# Patient Record
Sex: Female | Born: 1985 | Race: Black or African American | Hispanic: No | Marital: Married | Smoking: Never smoker
Health system: Southern US, Community
[De-identification: ages and names within clinical notes are randomized; demographics above are authoritative.]

## PROBLEM LIST (undated history)

## (undated) DIAGNOSIS — Z3490 Encounter for supervision of normal pregnancy, unspecified, unspecified trimester: Secondary | ICD-10-CM

## (undated) DIAGNOSIS — D649 Anemia, unspecified: Secondary | ICD-10-CM

## (undated) DIAGNOSIS — R110 Nausea: Secondary | ICD-10-CM

## (undated) HISTORY — DX: Nausea: R11.0

## (undated) HISTORY — DX: Encounter for supervision of normal pregnancy, unspecified, unspecified trimester: Z34.90

---
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
## 2006-10-29 ENCOUNTER — Emergency Department (HOSPITAL_COMMUNITY): Admission: EM | Admit: 2006-10-29 | Discharge: 2006-10-29 | Payer: Self-pay | Admitting: Emergency Medicine

## 2007-04-18 ENCOUNTER — Ambulatory Visit: Payer: Self-pay | Admitting: Oncology

## 2007-05-04 ENCOUNTER — Ambulatory Visit (HOSPITAL_COMMUNITY): Admission: RE | Admit: 2007-05-04 | Discharge: 2007-05-04 | Payer: Self-pay | Admitting: Obstetrics and Gynecology

## 2007-06-26 ENCOUNTER — Inpatient Hospital Stay (HOSPITAL_COMMUNITY): Admission: AD | Admit: 2007-06-26 | Discharge: 2007-06-26 | Payer: Self-pay | Admitting: Obstetrics and Gynecology

## 2007-06-30 ENCOUNTER — Inpatient Hospital Stay (HOSPITAL_COMMUNITY): Admission: AD | Admit: 2007-06-30 | Discharge: 2007-07-02 | Payer: Self-pay | Admitting: Obstetrics and Gynecology

## 2008-04-26 ENCOUNTER — Emergency Department (HOSPITAL_COMMUNITY): Admission: EM | Admit: 2008-04-26 | Discharge: 2008-04-26 | Payer: Self-pay | Admitting: Emergency Medicine

## 2008-09-02 ENCOUNTER — Emergency Department (HOSPITAL_COMMUNITY): Admission: EM | Admit: 2008-09-02 | Discharge: 2008-09-02 | Payer: Self-pay | Admitting: Emergency Medicine

## 2009-09-08 ENCOUNTER — Emergency Department (HOSPITAL_COMMUNITY): Admission: EM | Admit: 2009-09-08 | Discharge: 2009-09-08 | Payer: Self-pay | Admitting: Emergency Medicine

## 2010-03-18 ENCOUNTER — Emergency Department (HOSPITAL_COMMUNITY)
Admission: EM | Admit: 2010-03-18 | Discharge: 2010-03-18 | Payer: Self-pay | Source: Home / Self Care | Admitting: Emergency Medicine

## 2010-06-14 ENCOUNTER — Encounter: Payer: Self-pay | Admitting: Obstetrics and Gynecology

## 2010-08-11 LAB — POCT PREGNANCY, URINE: Preg Test, Ur: NEGATIVE

## 2010-10-06 NOTE — H&P (Signed)
Sharon, Lutz            ACCOUNT NO.:  [id number]   MEDICAL RECORD NO.:  [id number]          PATIENT TYPE:  INP   LOCATION:  9165                          FACILITY:  [facility]   PHYSICIAN:  Hal Morales, M.D.DATE OF BIRTH:  [birth year]   DATE OF ADMISSION:  06/30/2007  DATE OF DISCHARGE:                              HISTORY & PHYSICAL   The patient is a 25 year old, single black female, gravida 2, para 0-0-1-  0 at 39-4/7 weeks for an [facility] of July 04, 2007 which was set by a 12-  week ultrasound who presents a chief complaint of spontaneous rupture of  membranes at 2330 on June 29, 2007.  The patient denies any vaginal  bleeding, dysuria, nausea, vomiting, diarrhea, PIH signs or symptoms.  Reports positive fetal movement.  She denies any pain. She reports some  cramping. She also reports that she was 3 cm in the office yesterday.  Her pregnancy has been followed by the MD service at [facility].  Her history is remarkable:  1. Group beta strep positive.  2. Thrombocytopenia.  3. Conception just after discontinuation of oral contraceptive pills.   PRENATAL LABORATORIES:  The patient's blood type is O positive, RH  antibody screen negative.  RPR nonreactive.  Rubella titer immune.  Hepatitis surface antigen negative.  HIV nonreactive.  She had an  hemoglobin electrophoresis which was within normal limits.  She declined  cystic fibrosis testing. Gonorrhea and Chlamydia negative.  Group Beta  strep positive.  Platelets at her new OB visit July 8 were 149.  Hemoglobin at that time was 11.7.  She had a normal one-hour GTT.  Her  platelets have been checked numerous times during the pregnancy.  She  did fail to follow up with a hem/onc consult as recommended per MDs at  [facility].  Platelets on January 5 were equal to 117.  That is  the last platelet count on her prenatal record which only goes to 36-3/7  weeks.   OB HISTORY:  Gravida 1, with an induced  abortion at approximately 13  weeks in April 2004.  She denied any complications.  G2 with current  pregnancy.   PAST MEDICAL HISTORY:  1. The patient reports previous use of Seasonale and condoms for      contraception.  2. She reports chicken pox as a child and rubella vaccine.  3. She reports menarche at 25 years of age with 28-day cycles, 5 days      of flow.   GENETIC HISTORY:  Unremarkable.   PAST SURGICAL HISTORY:  Denies any surgical history with the exception  of a root canal.   FAMILY HISTORY:  Maternal grandmother with chronic hypertension.  Paternal grandmother, maternal aunt and maternal uncle with diabetes.  Paternal grandmother TIAs.  Mom with nicotine addiction.   SOCIAL HISTORY:  Single black female.  Did not report a religious  affiliation.  Full time student at [employer].  She reports 13 years of  education.  Father of baby self-employed, 12 years of education.  She  denied any alcohol use or illicit drug use.  Did  report second-hand  smoke exposure.   HISTORY OF PRESENT PREGNANCY:  The patient began care at approximately 7-  6/7 weeks for a new OB interview.  She did have a new OB work-up July 8  at approximately 10-5/7 weeks.  The patient did have a first trimester  screen which was within normal limits at approximately 12 weeks.  Ultrasound that day showed SIUP 12 weeks and 6 days, giving EDC of  July 03, 2006, giving our best [facility].  Placenta was anterior. First  trimester screen was within normal limits, nasal bone present.  The  patient had lost 13 pounds initially at the beginning of pregnancy and  did have good weight gain overall. The patient had an anatomy scan at 19-  5/7 weeks. All anatomy was not seen at that day and plan was made to  repeat.  The patient had a followup AFP which was normal.  Anatomy scan  was repeated at 23-6/7 weeks.  All anatomy not previously seen was  visualized and normal growth and development were noted.  Cervical  length was  3.22.  The patient had one-hour GTT at 28-2/7 weeks.  Platelets were redrawn equal to 119.  Plans were made for the patient to  have hem/onc consult regarding thrombocytopenia in which the patient was  unable to make an appointment and did not have followup.  The patient  had a maternal fetal medicine consult on  December 10 regarding her  thrombocytopenia.  Platelets on December 9 were equal to 129.  Per MFM,  the patient was to have weekly platelet count drawn.  The patient did  have complications with reflux for almost 35 weeks.  The patient did  have an episode of vaginal bleeding at approximately 36 weeks.  Ultrasounds were suggestive of a female.  The patient plans to name the  baby Rodman Pickle.   PHYSICAL EXAMINATION:  VITAL SIGNS:  On admission, blood pressure  129/80, heart rate 93, temperature 97.8, respirations 18.  EFM showed  fetal heart rate baseline in the 130s with moderate variability, 10 x 10  accelerations and no decelerations.  Tocometer showing 2 contractions on  the monitor with irritability.  GENERAL:  No acute stress.  Alert and oriented x 3 and pleasant.  HEENT: Within normal limits.  CARDIOVASCULAR:  Regular rate and rhythm without murmur.  LUNGS:  Clear to auscultation bilaterally.  ABDOMEN:  Soft, nontender, and gravid.  Estimated fetal weight 7 to 7-  1/2 pounds.  PELVIC:  Sterile speculum exam was negative Nitrazine, positive pooling,  positive fern.  Cervix was 3 cm dilated, 80%, minus 2 and vertex.  No  vaginal bleeding.  No lesions or abnormalities noted internally or  externally on speculum exam.  EXTREMITIES:  Without edema and negative.  No clonus.  DTRs 1+.   IMPRESSION:  1. Intrauterine pregnancy at 39-4/7 weeks.  2. Spontaneous rupture of membranes June 29, 2007 at 2330.  3. Positive group beta strep status.  4. Thrombocytopenia in pregnancy.   PLAN:  1. Admit to birthing suite with Dr. Pennie Rushing as the attending      physician.  2. Routine  labor and delivery orders.  3. Epidural p.r.n.  4. Penicillin G IV per protocol for GBS prophylaxis.  5. Plan Pitocin p.r.n. for labor augmentation.      Sharon Lutz, [place]      Hal Morales, M.D.  Electronically Signed    CHS/MEDQ  D:  06/30/2007  T:  06/30/2007  Job:  [id number] 

## 2011-02-12 LAB — CBC
HCT: 31.4 — ABNORMAL LOW
HCT: 34.9 — ABNORMAL LOW
Hemoglobin: 11.8 — ABNORMAL LOW
MCHC: 33.9
MCV: 89.8
MCV: 89.9
Platelets: 123 — ABNORMAL LOW
Platelets: 146 — ABNORMAL LOW
RBC: 3.88
RDW: 13.7
WBC: 8.6

## 2011-02-12 LAB — RPR: RPR Ser Ql: NONREACTIVE

## 2011-03-11 LAB — URINALYSIS, ROUTINE W REFLEX MICROSCOPIC
Hgb urine dipstick: NEGATIVE
Nitrite: NEGATIVE
pH: 6.5

## 2011-03-11 LAB — CBC
HCT: 37.4
MCV: 86.6
Platelets: 156
RDW: 13

## 2011-03-11 LAB — DIFFERENTIAL
Basophils Absolute: 0
Basophils Relative: 1
Eosinophils Absolute: 0.1
Eosinophils Relative: 1
Neutro Abs: 3.4

## 2011-03-11 LAB — I-STAT 8, (EC8 V) (CONVERTED LAB)
BUN: 13
Chloride: 106
Sodium: 137
pCO2, Ven: 39.3 — ABNORMAL LOW
pH, Ven: 7.352 — ABNORMAL HIGH

## 2011-03-11 LAB — POCT PREGNANCY, URINE: Operator id: 151321

## 2011-03-11 LAB — POCT I-STAT CREATININE: Creatinine, Ser: 1

## 2011-03-11 LAB — URINE MICROSCOPIC-ADD ON
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
## 2013-01-12 ENCOUNTER — Other Ambulatory Visit: Payer: Self-pay | Admitting: Obstetrics & Gynecology

## 2013-01-12 DIAGNOSIS — O3680X0 Pregnancy with inconclusive fetal viability, not applicable or unspecified: Secondary | ICD-10-CM

## 2013-01-17 ENCOUNTER — Ambulatory Visit (INDEPENDENT_AMBULATORY_CARE_PROVIDER_SITE_OTHER): Payer: Medicaid Other

## 2013-01-17 DIAGNOSIS — O3680X0 Pregnancy with inconclusive fetal viability, not applicable or unspecified: Secondary | ICD-10-CM

## 2013-01-17 NOTE — Progress Notes (Signed)
U/S(6+6wks)-single IUP with +FCA noted, CRL c/w LMP dates, cx long and closed, bilateral adnexa WNL with C.L. Noted on LT = 2.8 x 2.6cm , FHR=131 bpm

## 2013-01-31 ENCOUNTER — Ambulatory Visit (INDEPENDENT_AMBULATORY_CARE_PROVIDER_SITE_OTHER): Payer: Medicaid Other | Admitting: Adult Health

## 2013-01-31 ENCOUNTER — Encounter: Payer: Self-pay | Admitting: Adult Health

## 2013-01-31 VITALS — BP 100/64 | Ht 69.0 in | Wt 201.0 lb

## 2013-01-31 DIAGNOSIS — Z331 Pregnant state, incidental: Secondary | ICD-10-CM

## 2013-01-31 DIAGNOSIS — R110 Nausea: Secondary | ICD-10-CM

## 2013-01-31 DIAGNOSIS — Z3490 Encounter for supervision of normal pregnancy, unspecified, unspecified trimester: Secondary | ICD-10-CM

## 2013-01-31 DIAGNOSIS — O99019 Anemia complicating pregnancy, unspecified trimester: Secondary | ICD-10-CM

## 2013-01-31 DIAGNOSIS — Z3480 Encounter for supervision of other normal pregnancy, unspecified trimester: Secondary | ICD-10-CM

## 2013-01-31 DIAGNOSIS — Z1389 Encounter for screening for other disorder: Secondary | ICD-10-CM

## 2013-01-31 HISTORY — DX: Nausea: R11.0

## 2013-01-31 HISTORY — DX: Encounter for supervision of normal pregnancy, unspecified, unspecified trimester: Z34.90

## 2013-01-31 LAB — POCT URINALYSIS DIPSTICK
Blood, UA: NEGATIVE
Glucose, UA: NEGATIVE

## 2013-01-31 LAB — CBC
MCH: 28 pg (ref 26.0–34.0)
RDW: 14.7 % (ref 11.5–15.5)
WBC: 3.5 10*3/uL — ABNORMAL LOW (ref 4.0–10.5)

## 2013-01-31 LAB — URINALYSIS, ROUTINE W REFLEX MICROSCOPIC
Leukocytes, UA: NEGATIVE
pH: 8 (ref 5.0–8.0)

## 2013-01-31 LAB — RPR

## 2013-01-31 NOTE — Patient Instructions (Addendum)
Nausea and Vomiting Nausea is a sick feeling that often comes before throwing up (vomiting). Vomiting is a reflex where stomach contents come out of your mouth. Vomiting can cause severe loss of body fluids (dehydration). Children and elderly adults can become dehydrated quickly, especially if they also have diarrhea. Nausea and vomiting are symptoms of a condition or disease. It is important to find the cause of your symptoms. CAUSES   Direct irritation of the stomach lining. This irritation can result from increased acid production (gastroesophageal reflux disease), infection, food poisoning, taking certain medicines (such as nonsteroidal anti-inflammatory drugs), alcohol use, or tobacco use.  Signals from the brain.These signals could be caused by a headache, heat exposure, an inner ear disturbance, increased pressure in the brain from injury, infection, a tumor, or a concussion, pain, emotional stimulus, or metabolic problems.  An obstruction in the gastrointestinal tract (bowel obstruction).  Illnesses such as diabetes, hepatitis, gallbladder problems, appendicitis, kidney problems, cancer, sepsis, atypical symptoms of a heart attack, or eating disorders.  Medical treatments such as chemotherapy and radiation.  Receiving medicine that makes you sleep (general anesthetic) during surgery. DIAGNOSIS Your caregiver may ask for tests to be done if the problems do not improve after a few days. Tests may also be done if symptoms are severe or if the reason for the nausea and vomiting is not clear. Tests may include:  Urine tests.  Blood tests.  Stool tests.  Cultures (to look for evidence of infection).  X-rays or other imaging studies. Test results can help your caregiver make decisions about treatment or the need for additional tests. TREATMENT You need to stay well hydrated. Drink frequently but in small amounts.You may wish to drink water, sports drinks, clear broth, or eat frozen  ice pops or gelatin dessert to help stay hydrated.When you eat, eating slowly may help prevent nausea.There are also some antinausea medicines that may help prevent nausea. HOME CARE INSTRUCTIONS   Take all medicine as directed by your caregiver.  If you do not have an appetite, do not force yourself to eat. However, you must continue to drink fluids.  If you have an appetite, eat a normal diet unless your caregiver tells you differently.  Eat a variety of complex carbohydrates (rice, wheat, potatoes, bread), lean meats, yogurt, fruits, and vegetables.  Avoid high-fat foods because they are more difficult to digest.  Drink enough water and fluids to keep your urine clear or pale yellow.  If you are dehydrated, ask your caregiver for specific rehydration instructions. Signs of dehydration may include:  Severe thirst.  Dry lips and mouth.  Dizziness.  Dark urine.  Decreasing urine frequency and amount.  Confusion.  Rapid breathing or pulse. SEEK IMMEDIATE MEDICAL CARE IF:   You have blood or brown flecks (like coffee grounds) in your vomit.  You have black or bloody stools.  You have a severe headache or stiff neck.  You are confused.  You have severe abdominal pain.  You have chest pain or trouble breathing.  You do not urinate at least once every 8 hours.  You develop cold or clammy skin.  You continue to vomit for longer than 24 to 48 hours.  You have a fever. MAKE SURE YOU:   Understand these instructions.  Will watch your condition.  Will get help right away if you are not doing well or get worse. Document Released: 05/10/2005 Document Revised: 08/02/2011 Document Reviewed: 10/07/2010 [facility] Patient Information 2014 [place], [place]. Pregnancy - First Trimester During  sexual intercourse, millions of sperm go into the vagina. Only 1 sperm will penetrate and fertilize the female egg while it is in the Fallopian tube. One week later, the fertilized egg  implants into the wall of the uterus. An embryo begins to develop into a baby. At 6 to 8 weeks, the eyes and face are formed and the heartbeat can be seen on ultrasound. At the end of 12 weeks (first trimester), all the baby's organs are formed. Now that you are pregnant, you will want to do everything you can to have a healthy baby. Two of the most important things are to get good prenatal care and follow your caregiver's instructions. Prenatal care is all the medical care you receive before the baby's birth. It is given to prevent, find, and treat problems during the pregnancy and childbirth. PRENATAL EXAMS  During prenatal visits, your weight, blood pressure, and urine are checked. This is done to make sure you are healthy and progressing normally during the pregnancy.  A pregnant woman should gain 25 to 35 pounds during the pregnancy. However, if you are overweight or underweight, your caregiver will advise you regarding your weight.  Your caregiver will ask and answer questions for you.  Blood work, cervical cultures, other necessary tests, and a Pap test are done during your prenatal exams. These tests are done to check on your health and the probable health of your baby. Tests are strongly recommended and done for HIV with your permission. This is the virus that causes AIDS. These tests are done because medicines can be given to help prevent your baby from being born with this infection should you have been infected without knowing it. Blood work is also used to find out your blood type, previous infections, and follow your blood levels (hemoglobin).  Low hemoglobin (anemia) is common during pregnancy. Iron and vitamins are given to help prevent this. Later in the pregnancy, blood tests for diabetes will be done along with any other tests if any problems develop.  You may need other tests to make sure you and the baby are doing well. CHANGES DURING THE FIRST TRIMESTER  Your body goes through  many changes during pregnancy. They vary from person to person. Talk to your caregiver about changes you notice and are concerned about. Changes can include:  Your menstrual period stops.  The egg and sperm carry the genes that determine what you look like. Genes from you and your partner are forming a baby. The female genes determine whether the baby is a boy or a girl.  Your body increases in girth and you may feel bloated.  Feeling sick to your stomach (nauseous) and throwing up (vomiting). If the vomiting is uncontrollable, call your caregiver.  Your breasts will begin to enlarge and become tender.  Your nipples may stick out more and become darker.  The need to urinate more. Painful urination may mean you have a bladder infection.  Tiring easily.  Loss of appetite.  Cravings for certain kinds of food.  At first, you may gain or lose a couple of pounds.  You may have changes in your emotions from day to day (excited to be pregnant or concerned something may go wrong with the pregnancy and baby).  You may have more vivid and strange dreams. HOME CARE INSTRUCTIONS   It is very important to avoid all smoking, alcohol and non-prescribed drugs during your pregnancy. These affect the formation and growth of the baby. Avoid chemicals while pregnant  to ensure the delivery of a healthy infant.  Start your prenatal visits by the 12th week of pregnancy. They are usually scheduled monthly at first, then more often in the last 2 months before delivery. Keep your caregiver's appointments. Follow your caregiver's instructions regarding medicine use, blood and lab tests, exercise, and diet.  During pregnancy, you are providing food for you and your baby. Eat regular, well-balanced meals. Choose foods such as meat, fish, milk and other low fat dairy products, vegetables, fruits, and whole-grain breads and cereals. Your caregiver will tell you of the ideal weight gain.  You can help morning  sickness by keeping soda crackers at the bedside. Eat a couple before arising in the morning. You may want to use the crackers without salt on them.  Eating 4 to 5 small meals rather than 3 large meals a day also may help the nausea and vomiting.  Drinking liquids between meals instead of during meals also seems to help nausea and vomiting.  A physical sexual relationship may be continued throughout pregnancy if there are no other problems. Problems may be early (premature) leaking of amniotic fluid from the membranes, vaginal bleeding, or belly (abdominal) pain.  Exercise regularly if there are no restrictions. Check with your caregiver or physical therapist if you are unsure of the safety of some of your exercises. Greater weight gain will occur in the last 2 trimesters of pregnancy. Exercising will help:  Control your weight.  Keep you in shape.  Prepare you for labor and delivery.  Help you lose your pregnancy weight after you deliver your baby.  Wear a good support or jogging bra for breast tenderness during pregnancy. This may help if worn during sleep too.  Ask when prenatal classes are available. Begin classes when they are offered.  Do not use hot tubs, steam rooms, or saunas.  Wear your seat belt when driving. This protects you and your baby if you are in an accident.  Avoid raw meat, uncooked cheese, cat litter boxes, and soil used by cats throughout the pregnancy. These carry germs that can cause birth defects in the baby.  The first trimester is a good time to visit your dentist for your dental health. Getting your teeth cleaned is okay. Use a softer toothbrush and brush gently during pregnancy.  Ask for help if you have financial, counseling, or nutritional needs during pregnancy. Your caregiver will be able to offer counseling for these needs as well as refer you for other special needs.  Do not take any medicines or herbs unless told by your caregiver.  Inform your  caregiver if there is any mental or physical domestic violence.  Make a list of emergency phone numbers of family, friends, hospital, and police and fire departments.  Write down your questions. Take them to your prenatal visit.  Do not douche.  Do not cross your legs.  If you have to stand for long periods of time, rotate you feet or take small steps in a circle.  You may have more vaginal secretions that may require a sanitary pad. Do not use tampons or scented sanitary pads. MEDICINES AND DRUG USE IN PREGNANCY  Take prenatal vitamins as directed. The vitamin should contain 1 milligram of folic acid. Keep all vitamins out of reach of children. Only a couple vitamins or tablets containing iron may be fatal to a baby or young child when ingested.  Avoid use of all medicines, including herbs, over-the-counter medicines, not prescribed or suggested by  your caregiver. Only take over-the-counter or prescription medicines for pain, discomfort, or fever as directed by your caregiver. Do not use aspirin, ibuprofen, or naproxen unless directed by your caregiver.  Let your caregiver also know about herbs you may be using.  Alcohol is related to a number of birth defects. This includes fetal alcohol syndrome. All alcohol, in any form, should be avoided completely. Smoking will cause low birth rate and premature babies.  Street or illegal drugs are very harmful to the baby. They are absolutely forbidden. A baby born to an addicted mother will be addicted at birth. The baby will go through the same withdrawal an adult does.  Let your caregiver know about any medicines that you have to take and for what reason you take them. SEEK MEDICAL CARE IF:  You have any concerns or worries during your pregnancy. It is better to call with your questions if you feel they cannot wait, rather than worry about them. SEEK IMMEDIATE MEDICAL CARE IF:   An unexplained oral temperature above 102 F (38.9 C) develops,  or as your caregiver suggests.  You have leaking of fluid from the vagina (birth canal). If leaking membranes are suspected, take your temperature and inform your caregiver of this when you call.  There is vaginal spotting or bleeding. Notify your caregiver of the amount and how many pads are used.  You develop a bad smelling vaginal discharge with a change in the color.  You continue to feel sick to your stomach (nauseated) and have no relief from remedies suggested. You vomit blood or coffee ground-like materials.  You lose more than 2 pounds of weight in 1 week.  You gain more than 2 pounds of weight in 1 week and you notice swelling of your face, hands, feet, or legs.  You gain 5 pounds or more in 1 week (even if you do not have swelling of your hands, face, legs, or feet).  You get exposed to [place] measles and have never had them.  You are exposed to fifth disease or chickenpox.  You develop belly (abdominal) pain. Round ligament discomfort is a common non-cancerous (benign) cause of abdominal pain in pregnancy. Your caregiver still must evaluate this.  You develop headache, fever, diarrhea, pain with urination, or shortness of breath.  You fall or are in a car accident or have any kind of trauma.  There is mental or physical violence in your home. Document Released: 05/04/2001 Document Revised: 02/02/2012 Document Reviewed: 11/05/2008 [facility] Patient Information 2014 [place], [place]. Return in 3 weeks for IT/NT

## 2013-01-31 NOTE — Progress Notes (Signed)
C/o nausea "does not feel good at all" no appetite.

## 2013-01-31 NOTE — Progress Notes (Signed)
  Subjective:    Sharon Lutz is a 27 y.o. G7P1001 African American female at [redacted]w[redacted]d by LMP being seen today for her first obstetrical visit.  Her obstetrical history is significant for nausea.  Pregnancy history fully reviewed.   Patient reports nausea.will try diclegis, Number of samples 3 Lot [id number]    Exp date 03/23/14  Filed Vitals:   01/31/13 0944  BP: 100/64  Weight: 201 lb (91.173 kg)    HISTORY: OB History  Gravida Para Term Preterm AB SAB TAB Ectopic Multiple Living  2 1 1       1     # Outcome Date GA Lbr Len/2nd Weight Sex Delivery Anes PTL Lv  2 CUR           1 TRM 06/30/07 [redacted]w[redacted]d   F SVD EPI  Y     History reviewed. No pertinent past medical history. History reviewed. No pertinent past surgical history. Family History  Problem Relation Age of Onset  . Diabetes Maternal Grandmother   . Diabetes Maternal Aunt   . Diabetes Maternal Uncle      Exam    Pelvic Exam:    Perineum: deferred   Vulva: deferred   Vagina:  deferred   Uterus   9 weeks     Cervix: deferred   Adnexa: Not palpable   Urinary: deferred    System:     Skin: normal coloration and turgor, no rashes    Neurologic: oriented, normal mood   Extremities: normal strength, tone, and muscle mass   HEENT PERRLA   Mouth/Teeth mucous membranes moist   Cardiovascular: regular rate and rhythm   Respiratory:  appears well, vitals normal, no respiratory distress, acyanotic, normal RR   Abdomen: soft, non-tender    Had pap [facility] will request copy   Assessment:    Pregnancy: G2P1001 There are no active problems to display for this patient.     [redacted]w[redacted]d G2P1001 New OB visit    Plan:     Initial labs drawn Continue prenatal vitamins Problem list reviewed and updated Reviewed n/v relief measures and warning s/s to report Reviewed recommended weight gain based on pre-gravid BMI Encouraged well-balanced diet Genetic Screening discussed Integrated Screen: requested Cystic  fibrosis screening discussed declined Ultrasound discussed; fetal survey: requested Follow up in 3 weeks for IT/NT and see me CCNC form done, consents signed for IT/NT GRIFFIN,JENNIFER 01/31/2013 10:12 AM

## 2013-02-01 LAB — DRUG SCREEN, URINE, NO CONFIRMATION
Amphetamine Screen, Ur: NEGATIVE
Barbiturate Quant, Ur: NEGATIVE
Benzodiazepines.: NEGATIVE
Cocaine Metabolites: NEGATIVE
Creatinine,U: 78.3 mg/dL
Marijuana Metabolite: NEGATIVE
Methadone: NEGATIVE
Opiate Screen, Urine: NEGATIVE
Phencyclidine (PCP): NEGATIVE

## 2013-02-01 LAB — OXYCODONE SCREEN, UA, RFLX CONFIRM: Oxycodone Screen, Ur: NEGATIVE ng/mL

## 2013-02-01 LAB — TSH: TSH: 0.131 u[IU]/mL — ABNORMAL LOW (ref 0.350–4.500)

## 2013-02-01 LAB — VARICELLA ZOSTER ANTIBODY, IGG: Varicella IgG: 1443 Index — ABNORMAL HIGH (ref <135.00)

## 2013-02-01 LAB — GC/CHLAMYDIA PROBE AMP
CT Probe RNA: NEGATIVE
GC Probe RNA: NEGATIVE

## 2013-02-01 LAB — SICKLE CELL SCREEN: Sickle Cell Screen: NEGATIVE

## 2013-02-01 LAB — RUBELLA SCREEN: Rubella: 1.36 Index — ABNORMAL HIGH (ref <0.90)

## 2013-02-01 LAB — ABO AND RH: Rh Type: POSITIVE

## 2013-02-23 ENCOUNTER — Ambulatory Visit (INDEPENDENT_AMBULATORY_CARE_PROVIDER_SITE_OTHER): Payer: Medicaid Other | Admitting: Adult Health

## 2013-02-23 ENCOUNTER — Other Ambulatory Visit: Payer: Self-pay | Admitting: Adult Health

## 2013-02-23 ENCOUNTER — Ambulatory Visit (INDEPENDENT_AMBULATORY_CARE_PROVIDER_SITE_OTHER): Payer: Medicaid Other

## 2013-02-23 ENCOUNTER — Encounter: Payer: Self-pay | Admitting: Adult Health

## 2013-02-23 VITALS — BP 112/70 | Wt 200.0 lb

## 2013-02-23 DIAGNOSIS — Z1389 Encounter for screening for other disorder: Secondary | ICD-10-CM

## 2013-02-23 DIAGNOSIS — Z36 Encounter for antenatal screening of mother: Secondary | ICD-10-CM

## 2013-02-23 DIAGNOSIS — O99019 Anemia complicating pregnancy, unspecified trimester: Secondary | ICD-10-CM

## 2013-02-23 DIAGNOSIS — Z331 Pregnant state, incidental: Secondary | ICD-10-CM

## 2013-02-23 DIAGNOSIS — Z3480 Encounter for supervision of other normal pregnancy, unspecified trimester: Secondary | ICD-10-CM

## 2013-02-23 LAB — POCT URINALYSIS DIPSTICK
Ketones, UA: NEGATIVE
Protein, UA: NEGATIVE

## 2013-02-23 NOTE — Progress Notes (Signed)
U/S(12+1wks)single IUP with +FCA noted, CRL c/w dates, cx long and closed, bilateral adnexa wnl, NB present, NT-1.39mm

## 2013-02-23 NOTE — Progress Notes (Signed)
No complaints at this time. IT NT today

## 2013-02-23 NOTE — Patient Instructions (Addendum)
Pregnancy - Second Trimester The second trimester of pregnancy (3 to 6 months) is a period of rapid growth for you and your baby. At the end of the sixth month, your baby is about 9 inches long and weighs 1 1/2 pounds. You will begin to feel the baby move between 18 and 20 weeks of the pregnancy. This is called quickening. Weight gain is faster. A clear fluid (colostrum) may leak out of your breasts. You may feel small contractions of the womb (uterus). This is known as false labor or Braxton-Hicks contractions. This is like a practice for labor when the baby is ready to be born. Usually, the problems with morning sickness have usually passed by the end of your first trimester. Some women develop small dark blotches (called cholasma, mask of pregnancy) on their face that usually goes away after the baby is born. Exposure to the sun makes the blotches worse. Acne may also develop in some pregnant women and pregnant women who have acne, may find that it goes away. PRENATAL EXAMS  Blood work may continue to be done during prenatal exams. These tests are done to check on your health and the probable health of your baby. Blood work is used to follow your blood levels (hemoglobin). Anemia (low hemoglobin) is common during pregnancy. Iron and vitamins are given to help prevent this. You will also be checked for diabetes between 24 and 28 weeks of the pregnancy. Some of the previous blood tests may be repeated.  The size of the uterus is measured during each visit. This is to make sure that the baby is continuing to grow properly according to the dates of the pregnancy.  Your blood pressure is checked every prenatal visit. This is to make sure you are not getting toxemia.  Your urine is checked to make sure you do not have an infection, diabetes or protein in the urine.  Your weight is checked often to make sure gains are happening at the suggested rate. This is to ensure that both you and your baby are growing  normally.  Sometimes, an ultrasound is performed to confirm the proper growth and development of the baby. This is a test which bounces harmless sound waves off the baby so your caregiver can more accurately determine due dates. Sometimes, a test is done on the amniotic fluid surrounding the baby. This test is called an amniocentesis. The amniotic fluid is obtained by sticking a needle into the belly (abdomen). This is done to check the chromosomes in instances where there is a concern about possible genetic problems with the baby. It is also sometimes done near the end of pregnancy if an early delivery is required. In this case, it is done to help make sure the baby's lungs are mature enough for the baby to live outside of the womb. CHANGES OCCURING IN THE SECOND TRIMESTER OF PREGNANCY Your body goes through many changes during pregnancy. They vary from person to person. Talk to your caregiver about changes you notice that you are concerned about.  During the second trimester, you will likely have an increase in your appetite. It is normal to have cravings for certain foods. This varies from person to person and pregnancy to pregnancy.  Your lower abdomen will begin to bulge.  You may have to urinate more often because the uterus and baby are pressing on your bladder. It is also common to get more bladder infections during pregnancy. You can help this by drinking lots of fluids  and emptying your bladder before and after intercourse.  You may begin to get stretch marks on your hips, abdomen, and breasts. These are normal changes in the body during pregnancy. There are no exercises or medicines to take that prevent this change.  You may begin to develop swollen and bulging veins (varicose veins) in your legs. Wearing support hose, elevating your feet for 15 minutes, 3 to 4 times a day and limiting salt in your diet helps lessen the problem.  Heartburn may develop as the uterus grows and pushes up  against the stomach. Antacids recommended by your caregiver helps with this problem. Also, eating smaller meals 4 to 5 times a day helps.  Constipation can be treated with a stool softener or adding bulk to your diet. Drinking lots of fluids, and eating vegetables, fruits, and whole grains are helpful.  Exercising is also helpful. If you have been very active up until your pregnancy, most of these activities can be continued during your pregnancy. If you have been less active, it is helpful to start an exercise program such as walking.  Hemorrhoids may develop at the end of the second trimester. Warm sitz baths and hemorrhoid cream recommended by your caregiver helps hemorrhoid problems.  Backaches may develop during this time of your pregnancy. Avoid heavy lifting, wear low heal shoes, and practice good posture to help with backache problems.  Some pregnant women develop tingling and numbness of their hand and fingers because of swelling and tightening of ligaments in the wrist (carpel tunnel syndrome). This goes away after the baby is born.  As your breasts enlarge, you may have to get a bigger bra. Get a comfortable, cotton, support bra. Do not get a nursing bra until the last month of the pregnancy if you will be nursing the baby.  You may get a dark line from your belly button to the pubic area called the linea nigra.  You may develop rosy cheeks because of increase blood flow to the face.  You may develop spider looking lines of the face, neck, arms, and chest. These go away after the baby is born. HOME CARE INSTRUCTIONS   It is extremely important to avoid all smoking, herbs, alcohol, and unprescribed drugs during your pregnancy. These chemicals affect the formation and growth of the baby. Avoid these chemicals throughout the pregnancy to ensure the delivery of a healthy infant.  Most of your home care instructions are the same as suggested for the first trimester of your pregnancy.  Keep your caregiver's appointments. Follow your caregiver's instructions regarding medicine use, exercise, and diet.  During pregnancy, you are providing food for you and your baby. Continue to eat regular, well-balanced meals. Choose foods such as meat, fish, milk and other low fat dairy products, vegetables, fruits, and whole-grain breads and cereals. Your caregiver will tell you of the ideal weight gain.  A physical sexual relationship may be continued up until near the end of pregnancy if there are no other problems. Problems could include early (premature) leaking of amniotic fluid from the membranes, vaginal bleeding, abdominal pain, or other medical or pregnancy problems.  Exercise regularly if there are no restrictions. Check with your caregiver if you are unsure of the safety of some of your exercises. The greatest weight gain will occur in the last 2 trimesters of pregnancy. Exercise will help you:  Control your weight.  Get you in shape for labor and delivery.  Lose weight after you have the baby.  Wear  a good support or jogging bra for breast tenderness during pregnancy. This may help if worn during sleep. Pads or tissues may be used in the bra if you are leaking colostrum.  Do not use hot tubs, steam rooms or saunas throughout the pregnancy.  Wear your seat belt at all times when driving. This protects you and your baby if you are in an accident.  Avoid raw meat, uncooked cheese, cat litter boxes, and soil used by cats. These carry germs that can cause birth defects in the baby.  The second trimester is also a good time to visit your dentist for your dental health if this has not been done yet. Getting your teeth cleaned is okay. Use a soft toothbrush. Brush gently during pregnancy.  It is easier to leak urine during pregnancy. Tightening up and strengthening the pelvic muscles will help with this problem. Practice stopping your urination while you are going to the bathroom.  These are the same muscles you need to strengthen. It is also the muscles you would use as if you were trying to stop from passing gas. You can practice tightening these muscles up 10 times a set and repeating this about 3 times per day. Once you know what muscles to tighten up, do not perform these exercises during urination. It is more likely to contribute to an infection by backing up the urine.  Ask for help if you have financial, counseling, or nutritional needs during pregnancy. Your caregiver will be able to offer counseling for these needs as well as refer you for other special needs.  Your skin may become oily. If so, wash your face with mild soap, use non-greasy moisturizer and oil or cream based makeup. MEDICINES AND DRUG USE IN PREGNANCY  Take prenatal vitamins as directed. The vitamin should contain 1 milligram of folic acid. Keep all vitamins out of reach of children. Only a couple vitamins or tablets containing iron may be fatal to a baby or young child when ingested.  Avoid use of all medicines, including herbs, over-the-counter medicines, not prescribed or suggested by your caregiver. Only take over-the-counter or prescription medicines for pain, discomfort, or fever as directed by your caregiver. Do not use aspirin.  Let your caregiver also know about herbs you may be using.  Alcohol is related to a number of birth defects. This includes fetal alcohol syndrome. All alcohol, in any form, should be avoided completely. Smoking will cause low birth rate and premature babies.  Street or illegal drugs are very harmful to the baby. They are absolutely forbidden. A baby born to an addicted mother will be addicted at birth. The baby will go through the same withdrawal an adult does. SEEK MEDICAL CARE IF:  You have any concerns or worries during your pregnancy. It is better to call with your questions if you feel they cannot wait, rather than worry about them. SEEK IMMEDIATE MEDICAL CARE  IF:   An unexplained oral temperature above 102 F (38.9 C) develops, or as your caregiver suggests.  You have leaking of fluid from the vagina (birth canal). If leaking membranes are suspected, take your temperature and tell your caregiver of this when you call.  There is vaginal spotting, bleeding, or passing clots. Tell your caregiver of the amount and how many pads are used. Light spotting in pregnancy is common, especially following intercourse.  You develop a bad smelling vaginal discharge with a change in the color from clear to white.  You continue to feel  sick to your stomach (nauseated) and have no relief from remedies suggested. You vomit blood or coffee ground-like materials.  You lose more than 2 pounds of weight or gain more than 2 pounds of weight over 1 week, or as suggested by your caregiver.  You notice swelling of your face, hands, feet, or legs.  You get exposed to [place] measles and have never had them.  You are exposed to fifth disease or chickenpox.  You develop belly (abdominal) pain. Round ligament discomfort is a common non-cancerous (benign) cause of abdominal pain in pregnancy. Your caregiver still must evaluate you.  You develop a bad headache that does not go away.  You develop fever, diarrhea, pain with urination, or shortness of breath.  You develop visual problems, blurry, or double vision.  You fall or are in a car accident or any kind of trauma.  There is mental or physical violence at home. Document Released: 05/04/2001 Document Revised: 02/02/2012 Document Reviewed: 11/06/2008 [facility] Patient Information 2014 [place], [place]. Follow up in 4 weeks for 2nd IT draw

## 2013-02-23 NOTE — Progress Notes (Signed)
No complaints, no bleeding no cramps, sleep at times,nausea better.will get first IT draw and return in 4 weeks for 2nd draw

## 2013-02-28 LAB — MATERNAL SCREEN, INTEGRATED #1

## 2013-03-02 ENCOUNTER — Encounter: Payer: Self-pay | Admitting: *Deleted

## 2013-03-02 DIAGNOSIS — R110 Nausea: Secondary | ICD-10-CM

## 2013-03-02 NOTE — Progress Notes (Signed)
Pt had normal pap on 12/14/11 , pap done at [facility].

## 2013-03-23 ENCOUNTER — Ambulatory Visit (INDEPENDENT_AMBULATORY_CARE_PROVIDER_SITE_OTHER): Payer: Medicaid Other | Admitting: Adult Health

## 2013-03-23 ENCOUNTER — Encounter (INDEPENDENT_AMBULATORY_CARE_PROVIDER_SITE_OTHER): Payer: Self-pay

## 2013-03-23 ENCOUNTER — Other Ambulatory Visit: Payer: Self-pay | Admitting: Obstetrics & Gynecology

## 2013-03-23 ENCOUNTER — Encounter: Payer: Self-pay | Admitting: Adult Health

## 2013-03-23 VITALS — BP 100/60 | Wt 204.0 lb

## 2013-03-23 DIAGNOSIS — Z331 Pregnant state, incidental: Secondary | ICD-10-CM

## 2013-03-23 DIAGNOSIS — O99019 Anemia complicating pregnancy, unspecified trimester: Secondary | ICD-10-CM

## 2013-03-23 DIAGNOSIS — Z1389 Encounter for screening for other disorder: Secondary | ICD-10-CM

## 2013-03-23 LAB — POCT URINALYSIS DIPSTICK
Blood, UA: NEGATIVE
Glucose, UA: NEGATIVE
Nitrite, UA: NEGATIVE

## 2013-03-23 NOTE — Patient Instructions (Signed)
Pregnancy - Second Trimester The second trimester of pregnancy (3 to 6 months) is a period of rapid growth for you and your baby. At the end of the sixth month, your baby is about 9 inches long and weighs 1 1/2 pounds. You will begin to feel the baby move between 18 and 20 weeks of the pregnancy. This is called quickening. Weight gain is faster. A clear fluid (colostrum) may leak out of your breasts. You may feel small contractions of the womb (uterus). This is known as false labor or Braxton-Hicks contractions. This is like a practice for labor when the baby is ready to be born. Usually, the problems with morning sickness have usually passed by the end of your first trimester. Some women develop small dark blotches (called cholasma, mask of pregnancy) on their face that usually goes away after the baby is born. Exposure to the sun makes the blotches worse. Acne may also develop in some pregnant women and pregnant women who have acne, may find that it goes away. PRENATAL EXAMS  Blood work may continue to be done during prenatal exams. These tests are done to check on your health and the probable health of your baby. Blood work is used to follow your blood levels (hemoglobin). Anemia (low hemoglobin) is common during pregnancy. Iron and vitamins are given to help prevent this. You will also be checked for diabetes between 24 and 28 weeks of the pregnancy. Some of the previous blood tests may be repeated.  The size of the uterus is measured during each visit. This is to make sure that the baby is continuing to grow properly according to the dates of the pregnancy.  Your blood pressure is checked every prenatal visit. This is to make sure you are not getting toxemia.  Your urine is checked to make sure you do not have an infection, diabetes or protein in the urine.  Your weight is checked often to make sure gains are happening at the suggested rate. This is to ensure that both you and your baby are growing  normally.  Sometimes, an ultrasound is performed to confirm the proper growth and development of the baby. This is a test which bounces harmless sound waves off the baby so your caregiver can more accurately determine due dates. Sometimes, a test is done on the amniotic fluid surrounding the baby. This test is called an amniocentesis. The amniotic fluid is obtained by sticking a needle into the belly (abdomen). This is done to check the chromosomes in instances where there is a concern about possible genetic problems with the baby. It is also sometimes done near the end of pregnancy if an early delivery is required. In this case, it is done to help make sure the baby's lungs are mature enough for the baby to live outside of the womb. CHANGES OCCURING IN THE SECOND TRIMESTER OF PREGNANCY Your body goes through many changes during pregnancy. They vary from person to person. Talk to your caregiver about changes you notice that you are concerned about.  During the second trimester, you will likely have an increase in your appetite. It is normal to have cravings for certain foods. This varies from person to person and pregnancy to pregnancy.  Your lower abdomen will begin to bulge.  You may have to urinate more often because the uterus and baby are pressing on your bladder. It is also common to get more bladder infections during pregnancy. You can help this by drinking lots of fluids  and emptying your bladder before and after intercourse.  You may begin to get stretch marks on your hips, abdomen, and breasts. These are normal changes in the body during pregnancy. There are no exercises or medicines to take that prevent this change.  You may begin to develop swollen and bulging veins (varicose veins) in your legs. Wearing support hose, elevating your feet for 15 minutes, 3 to 4 times a day and limiting salt in your diet helps lessen the problem.  Heartburn may develop as the uterus grows and pushes up  against the stomach. Antacids recommended by your caregiver helps with this problem. Also, eating smaller meals 4 to 5 times a day helps.  Constipation can be treated with a stool softener or adding bulk to your diet. Drinking lots of fluids, and eating vegetables, fruits, and whole grains are helpful.  Exercising is also helpful. If you have been very active up until your pregnancy, most of these activities can be continued during your pregnancy. If you have been less active, it is helpful to start an exercise program such as walking.  Hemorrhoids may develop at the end of the second trimester. Warm sitz baths and hemorrhoid cream recommended by your caregiver helps hemorrhoid problems.  Backaches may develop during this time of your pregnancy. Avoid heavy lifting, wear low heal shoes, and practice good posture to help with backache problems.  Some pregnant women develop tingling and numbness of their hand and fingers because of swelling and tightening of ligaments in the wrist (carpel tunnel syndrome). This goes away after the baby is born.  As your breasts enlarge, you may have to get a bigger bra. Get a comfortable, cotton, support bra. Do not get a nursing bra until the last month of the pregnancy if you will be nursing the baby.  You may get a dark line from your belly button to the pubic area called the linea nigra.  You may develop rosy cheeks because of increase blood flow to the face.  You may develop spider looking lines of the face, neck, arms, and chest. These go away after the baby is born. HOME CARE INSTRUCTIONS   It is extremely important to avoid all smoking, herbs, alcohol, and unprescribed drugs during your pregnancy. These chemicals affect the formation and growth of the baby. Avoid these chemicals throughout the pregnancy to ensure the delivery of a healthy infant.  Most of your home care instructions are the same as suggested for the first trimester of your pregnancy.  Keep your caregiver's appointments. Follow your caregiver's instructions regarding medicine use, exercise, and diet.  During pregnancy, you are providing food for you and your baby. Continue to eat regular, well-balanced meals. Choose foods such as meat, fish, milk and other low fat dairy products, vegetables, fruits, and whole-grain breads and cereals. Your caregiver will tell you of the ideal weight gain.  A physical sexual relationship may be continued up until near the end of pregnancy if there are no other problems. Problems could include early (premature) leaking of amniotic fluid from the membranes, vaginal bleeding, abdominal pain, or other medical or pregnancy problems.  Exercise regularly if there are no restrictions. Check with your caregiver if you are unsure of the safety of some of your exercises. The greatest weight gain will occur in the last 2 trimesters of pregnancy. Exercise will help you:  Control your weight.  Get you in shape for labor and delivery.  Lose weight after you have the baby.  Wear  a good support or jogging bra for breast tenderness during pregnancy. This may help if worn during sleep. Pads or tissues may be used in the bra if you are leaking colostrum.  Do not use hot tubs, steam rooms or saunas throughout the pregnancy.  Wear your seat belt at all times when driving. This protects you and your baby if you are in an accident.  Avoid raw meat, uncooked cheese, cat litter boxes, and soil used by cats. These carry germs that can cause birth defects in the baby.  The second trimester is also a good time to visit your dentist for your dental health if this has not been done yet. Getting your teeth cleaned is okay. Use a soft toothbrush. Brush gently during pregnancy.  It is easier to leak urine during pregnancy. Tightening up and strengthening the pelvic muscles will help with this problem. Practice stopping your urination while you are going to the bathroom.  These are the same muscles you need to strengthen. It is also the muscles you would use as if you were trying to stop from passing gas. You can practice tightening these muscles up 10 times a set and repeating this about 3 times per day. Once you know what muscles to tighten up, do not perform these exercises during urination. It is more likely to contribute to an infection by backing up the urine.  Ask for help if you have financial, counseling, or nutritional needs during pregnancy. Your caregiver will be able to offer counseling for these needs as well as refer you for other special needs.  Your skin may become oily. If so, wash your face with mild soap, use non-greasy moisturizer and oil or cream based makeup. MEDICINES AND DRUG USE IN PREGNANCY  Take prenatal vitamins as directed. The vitamin should contain 1 milligram of folic acid. Keep all vitamins out of reach of children. Only a couple vitamins or tablets containing iron may be fatal to a baby or young child when ingested.  Avoid use of all medicines, including herbs, over-the-counter medicines, not prescribed or suggested by your caregiver. Only take over-the-counter or prescription medicines for pain, discomfort, or fever as directed by your caregiver. Do not use aspirin.  Let your caregiver also know about herbs you may be using.  Alcohol is related to a number of birth defects. This includes fetal alcohol syndrome. All alcohol, in any form, should be avoided completely. Smoking will cause low birth rate and premature babies.  Street or illegal drugs are very harmful to the baby. They are absolutely forbidden. A baby born to an addicted mother will be addicted at birth. The baby will go through the same withdrawal an adult does. SEEK MEDICAL CARE IF:  You have any concerns or worries during your pregnancy. It is better to call with your questions if you feel they cannot wait, rather than worry about them. SEEK IMMEDIATE MEDICAL CARE  IF:   An unexplained oral temperature above 102 F (38.9 C) develops, or as your caregiver suggests.  You have leaking of fluid from the vagina (birth canal). If leaking membranes are suspected, take your temperature and tell your caregiver of this when you call.  There is vaginal spotting, bleeding, or passing clots. Tell your caregiver of the amount and how many pads are used. Light spotting in pregnancy is common, especially following intercourse.  You develop a bad smelling vaginal discharge with a change in the color from clear to white.  You continue to feel  sick to your stomach (nauseated) and have no relief from remedies suggested. You vomit blood or coffee ground-like materials.  You lose more than 2 pounds of weight or gain more than 2 pounds of weight over 1 week, or as suggested by your caregiver.  You notice swelling of your face, hands, feet, or legs.  You get exposed to [place] measles and have never had them.  You are exposed to fifth disease or chickenpox.  You develop belly (abdominal) pain. Round ligament discomfort is a common non-cancerous (benign) cause of abdominal pain in pregnancy. Your caregiver still must evaluate you.  You develop a bad headache that does not go away.  You develop fever, diarrhea, pain with urination, or shortness of breath.  You develop visual problems, blurry, or double vision.  You fall or are in a car accident or any kind of trauma.  There is mental or physical violence at home. Document Released: 05/04/2001 Document Revised: 02/02/2012 Document Reviewed: 11/06/2008 [facility] Patient Information 2014 [place], [place]. [place] in 3 weeks and see J[facility]

## 2013-03-23 NOTE — Progress Notes (Signed)
Has headaches but has not tried tylenol yet will try, has some heartburn try tums, or prilosec OTC, no bleeding or cramping, also complains of cold symptoms can try OTC measures as per handout return in 3 weeks for [place]

## 2013-03-27 ENCOUNTER — Telehealth: Payer: Self-pay | Admitting: Obstetrics and Gynecology

## 2013-03-27 MED ORDER — PRENATAL PLUS 27-1 MG PO TABS: 1.0000 | ORAL_TABLET | Freq: Every day | ORAL | Status: DC

## 2013-03-27 NOTE — Telephone Encounter (Signed)
Verbal order per Cyril Mourning, NP for prenatal plus 1 tablet daily, # 30 11 refills. Pt aware to follow up with pharmacy.

## 2013-03-29 LAB — MATERNAL SCREEN, INTEGRATED #2
Age risk Down Syndrome: 1:950 {titer}
Calculated Gestational Age: 16.6
Estriol Mom: 0.71
Estriol, Free: 0.61 ng/mL
Inhibin A Dimeric: 99 pg/mL
Inhibin A MoM: 0.66
NT MoM: 1.07
Rish for ONTD: <1:5000 {titer}
hCG MoM: 0.79

## 2013-04-11 ENCOUNTER — Ambulatory Visit (INDEPENDENT_AMBULATORY_CARE_PROVIDER_SITE_OTHER): Payer: Medicaid Other | Admitting: Obstetrics and Gynecology

## 2013-04-11 ENCOUNTER — Other Ambulatory Visit: Payer: Self-pay | Admitting: Adult Health

## 2013-04-11 ENCOUNTER — Encounter: Payer: Self-pay | Admitting: Obstetrics and Gynecology

## 2013-04-11 ENCOUNTER — Ambulatory Visit (INDEPENDENT_AMBULATORY_CARE_PROVIDER_SITE_OTHER): Payer: Medicaid Other

## 2013-04-11 VITALS — BP 110/60 | Wt 203.6 lb

## 2013-04-11 DIAGNOSIS — Z363 Encounter for antenatal screening for malformations: Secondary | ICD-10-CM

## 2013-04-11 DIAGNOSIS — Z1389 Encounter for screening for other disorder: Secondary | ICD-10-CM

## 2013-04-11 DIAGNOSIS — O99019 Anemia complicating pregnancy, unspecified trimester: Secondary | ICD-10-CM

## 2013-04-11 DIAGNOSIS — Z331 Pregnant state, incidental: Secondary | ICD-10-CM

## 2013-04-11 LAB — POCT URINALYSIS DIPSTICK
Blood, UA: NEGATIVE
Glucose, UA: NEGATIVE
Nitrite, UA: NEGATIVE
Protein, UA: NEGATIVE

## 2013-04-11 NOTE — Patient Instructions (Signed)
Plan to repeat u.s at 28wk .

## 2013-04-11 NOTE — Progress Notes (Signed)
U/S(18+6wks)-active fetus, meas c/w dates, fluid wnl, anterior gr 0 placenta, cx long and closed (3.9cm), bilateral adnexa wnl, female fetus "Chase", bilateral cardiac ventricle hyperechoic foci noted, all other cardiac structures appears WNL, no other abnl noted FHR- 152 bpm

## 2013-04-11 NOTE — Progress Notes (Signed)
Good FM, u/s reviewed , notable for EICF both ventricles.  Will repeat u.s 28 wk

## 2013-05-09 ENCOUNTER — Ambulatory Visit (INDEPENDENT_AMBULATORY_CARE_PROVIDER_SITE_OTHER): Payer: Medicaid Other | Admitting: Obstetrics and Gynecology

## 2013-05-09 ENCOUNTER — Encounter (INDEPENDENT_AMBULATORY_CARE_PROVIDER_SITE_OTHER): Payer: Self-pay

## 2013-05-09 VITALS — BP 120/70 | Wt 207.0 lb

## 2013-05-09 DIAGNOSIS — Z331 Pregnant state, incidental: Secondary | ICD-10-CM

## 2013-05-09 DIAGNOSIS — O99019 Anemia complicating pregnancy, unspecified trimester: Secondary | ICD-10-CM

## 2013-05-09 DIAGNOSIS — Z1389 Encounter for screening for other disorder: Secondary | ICD-10-CM

## 2013-05-09 LAB — POCT URINALYSIS DIPSTICK
Blood, UA: NEGATIVE
Ketones, UA: NEGATIVE
Protein, UA: NEGATIVE

## 2013-05-09 NOTE — Patient Instructions (Signed)
Diabetes testing at return

## 2013-05-09 NOTE — Progress Notes (Signed)
C/o "tailbone pain", also pressure suprapubic from growing uterus. No N, v ,d. Stable activity. Breast feeding classes desired. Pt informed of classes.

## 2013-05-09 NOTE — Progress Notes (Signed)
Pt states that the baby feels low and her tailbone hurts sometimes. PT denies any other problems or concerns at this time.

## 2013-05-24 NOTE — L&D Delivery Note (Signed)
Delivery Note At 7:51 AM a viable female was delivered via Vaginal, Spontaneous Delivery (Presentation: Left Occiput Transverse).  APGAR: 9, 10; weight TBD.   Placenta status: spontaneously intact.  Cord: 3 vessels with the following complications: None.    Anesthesia: Epidural  Episiotomy: None Lacerations: Labial Suture Repair: 3.0 monocryl Est. Blood Loss (mL): 500cc  Mom to postpartum.  Baby to Couplet care / Skin to Skin.  Mom pushed with good maternal effort to deliver a liveborn female with spontaneous cry from an occiput transverse position. Delayed cord clamping performed.  Placenta delivered spontaneously intact with 3v cord. Left labial tear with an external split repaired for cosmetic purposes with 3-0 monocryl. Pt tolerated the procedure well. No complications. Mom to postpartum and baby to skin to skin.   Romaldo Saville L Aristeo Hankerson 09/01/2013, 8:30 AM

## 2013-06-13 ENCOUNTER — Encounter: Payer: Medicaid Other | Admitting: Obstetrics and Gynecology

## 2013-06-13 ENCOUNTER — Other Ambulatory Visit: Payer: Medicaid Other

## 2013-06-15 ENCOUNTER — Other Ambulatory Visit: Payer: Medicaid Other

## 2013-06-15 ENCOUNTER — Ambulatory Visit (INDEPENDENT_AMBULATORY_CARE_PROVIDER_SITE_OTHER): Payer: Medicaid Other | Admitting: Obstetrics and Gynecology

## 2013-06-15 ENCOUNTER — Encounter (INDEPENDENT_AMBULATORY_CARE_PROVIDER_SITE_OTHER): Payer: Self-pay

## 2013-06-15 ENCOUNTER — Encounter: Payer: Self-pay | Admitting: Obstetrics and Gynecology

## 2013-06-15 VITALS — BP 110/60 | Wt 213.5 lb

## 2013-06-15 DIAGNOSIS — Z3490 Encounter for supervision of normal pregnancy, unspecified, unspecified trimester: Secondary | ICD-10-CM | POA: Insufficient documentation

## 2013-06-15 DIAGNOSIS — O99019 Anemia complicating pregnancy, unspecified trimester: Secondary | ICD-10-CM

## 2013-06-15 DIAGNOSIS — Z331 Pregnant state, incidental: Secondary | ICD-10-CM

## 2013-06-15 DIAGNOSIS — Z3480 Encounter for supervision of other normal pregnancy, unspecified trimester: Secondary | ICD-10-CM

## 2013-06-15 DIAGNOSIS — Z1389 Encounter for screening for other disorder: Secondary | ICD-10-CM

## 2013-06-15 LAB — CBC
HCT: 33.3 % — ABNORMAL LOW (ref 36.0–46.0)
Hemoglobin: 11.3 g/dL — ABNORMAL LOW (ref 12.0–15.0)
MCH: 28.8 pg (ref 26.0–34.0)
MCHC: 33.9 g/dL (ref 30.0–36.0)
MCV: 84.7 fL (ref 78.0–100.0)
Platelets: 131 10*3/uL — ABNORMAL LOW (ref 150–400)
RBC: 3.93 MIL/uL (ref 3.87–5.11)
RDW: 14.1 % (ref 11.5–15.5)
WBC: 5 10*3/uL (ref 4.0–10.5)

## 2013-06-15 LAB — POCT URINALYSIS DIPSTICK
Blood, UA: NEGATIVE
GLUCOSE UA: NEGATIVE
Ketones, UA: NEGATIVE
Leukocytes, UA: NEGATIVE
NITRITE UA: NEGATIVE
Protein, UA: NEGATIVE

## 2013-06-15 LAB — HIV ANTIBODY (ROUTINE TESTING W REFLEX): HIV: NONREACTIVE

## 2013-06-15 LAB — RPR

## 2013-06-15 NOTE — Progress Notes (Signed)
[age] year female presents for PN2 today. She is 2426w1d. She reports having some leg swelling but states she had this with 1st pregnancy.   Trace edema noted to BLE. 1+ patellar reflexes

## 2013-06-15 NOTE — Progress Notes (Signed)
Pt states that she is having back pain and pressure in her back. Pt states that her bottom hurts.

## 2013-06-15 NOTE — Patient Instructions (Signed)
Fetal Movement Counts Patient Name: __________________________________________________ Patient Due Date: ____________________ Performing a fetal movement count is highly recommended in high-risk pregnancies, but it is good for every pregnant woman to do. Your caregiver may ask you to start counting fetal movements at 28 weeks of the pregnancy. Fetal movements often increase:  After eating a full meal.  After physical activity.  After eating or drinking something sweet or cold.  At rest. Pay attention to when you feel the baby is most active. This will help you notice a pattern of your baby's sleep and wake cycles and what factors contribute to an increase in fetal movement. It is important to perform a fetal movement count at the same time each day when your baby is normally most active.  HOW TO COUNT FETAL MOVEMENTS 1. Find a quiet and comfortable area to sit or lie down on your left side. Lying on your left side provides the best blood and oxygen circulation to your baby. 2. Write down the day and time on a sheet of paper or in a journal. 3. Start counting kicks, flutters, swishes, rolls, or jabs in a 2 hour period. You should feel at least 10 movements within 2 hours. 4. If you do not feel 10 movements in 2 hours, wait 2 3 hours and count again. Look for a change in the pattern or not enough counts in 2 hours. SEEK MEDICAL CARE IF:  You feel less than 10 counts in 2 hours, tried twice.  There is no movement in over an hour.  The pattern is changing or taking longer each day to reach 10 counts in 2 hours.  You feel the baby is not moving as he or she usually does. Date: ____________ Movements: ____________ Start time: ____________ Finish time: ____________  Date: ____________ Movements: ____________ Start time: ____________ Finish time: ____________ Date: ____________ Movements: ____________ Start time: ____________ Finish time: ____________ Date: ____________ Movements: ____________  Start time: ____________ Finish time: ____________ Date: ____________ Movements: ____________ Start time: ____________ Finish time: ____________ Date: ____________ Movements: ____________ Start time: ____________ Finish time: ____________ Date: ____________ Movements: ____________ Start time: ____________ Finish time: ____________ Date: ____________ Movements: ____________ Start time: ____________ Finish time: ____________  Date: ____________ Movements: ____________ Start time: ____________ Finish time: ____________ Date: ____________ Movements: ____________ Start time: ____________ Finish time: ____________ Date: ____________ Movements: ____________ Start time: ____________ Finish time: ____________ Date: ____________ Movements: ____________ Start time: ____________ Finish time: ____________ Date: ____________ Movements: ____________ Start time: ____________ Finish time: ____________ Date: ____________ Movements: ____________ Start time: ____________ Finish time: ____________ Date: ____________ Movements: ____________ Start time: ____________ Finish time: ____________  Date: ____________ Movements: ____________ Start time: ____________ Finish time: ____________ Date: ____________ Movements: ____________ Start time: ____________ Finish time: ____________ Date: ____________ Movements: ____________ Start time: ____________ Finish time: ____________ Date: ____________ Movements: ____________ Start time: ____________ Finish time: ____________ Date: ____________ Movements: ____________ Start time: ____________ Finish time: ____________ Date: ____________ Movements: ____________ Start time: ____________ Finish time: ____________ Date: ____________ Movements: ____________ Start time: ____________ Finish time: ____________  Date: ____________ Movements: ____________ Start time: ____________ Finish time: ____________ Date: ____________ Movements: ____________ Start time: ____________ Finish time:  ____________ Date: ____________ Movements: ____________ Start time: ____________ Finish time: ____________ Date: ____________ Movements: ____________ Start time: ____________ Finish time: ____________ Date: ____________ Movements: ____________ Start time: ____________ Finish time: ____________ Date: ____________ Movements: ____________ Start time: ____________ Finish time: ____________ Date: ____________ Movements: ____________ Start time: ____________ Finish time: ____________  Date: ____________ Movements: ____________ Start time: ____________ Finish   time: ____________ Date: ____________ Movements: ____________ Start time: ____________ Finish time: ____________ Date: ____________ Movements: ____________ Start time: ____________ Finish time: ____________ Date: ____________ Movements: ____________ Start time: ____________ Finish time: ____________ Date: ____________ Movements: ____________ Start time: ____________ Finish time: ____________ Date: ____________ Movements: ____________ Start time: ____________ Finish time: ____________ Date: ____________ Movements: ____________ Start time: ____________ Finish time: ____________  Date: ____________ Movements: ____________ Start time: ____________ Finish time: ____________ Date: ____________ Movements: ____________ Start time: ____________ Finish time: ____________ Date: ____________ Movements: ____________ Start time: ____________ Finish time: ____________ Date: ____________ Movements: ____________ Start time: ____________ Finish time: ____________ Date: ____________ Movements: ____________ Start time: ____________ Finish time: ____________ Date: ____________ Movements: ____________ Start time: ____________ Finish time: ____________ Date: ____________ Movements: ____________ Start time: ____________ Finish time: ____________  Date: ____________ Movements: ____________ Start time: ____________ Finish time: ____________ Date: ____________ Movements:  ____________ Start time: ____________ Finish time: ____________ Date: ____________ Movements: ____________ Start time: ____________ Finish time: ____________ Date: ____________ Movements: ____________ Start time: ____________ Finish time: ____________ Date: ____________ Movements: ____________ Start time: ____________ Finish time: ____________ Date: ____________ Movements: ____________ Start time: ____________ Finish time: ____________ Date: ____________ Movements: ____________ Start time: ____________ Finish time: ____________  Date: ____________ Movements: ____________ Start time: ____________ Finish time: ____________ Date: ____________ Movements: ____________ Start time: ____________ Finish time: ____________ Date: ____________ Movements: ____________ Start time: ____________ Finish time: ____________ Date: ____________ Movements: ____________ Start time: ____________ Finish time: ____________ Date: ____________ Movements: ____________ Start time: ____________ Finish time: ____________ Date: ____________ Movements: ____________ Start time: ____________ Finish time: ____________ Document Released: 06/09/2006 Document Revised: 04/26/2012 Document Reviewed: 03/06/2012 [employer] Patient Information 2014 [employer].  

## 2013-06-16 LAB — GLUCOSE TOLERANCE, 2 HOURS W/ 1HR
GLUCOSE: 122 mg/dL (ref 70–170)
Glucose, 2 hour: 100 mg/dL (ref 70–139)
Glucose, Fasting: 77 mg/dL (ref 70–99)

## 2013-06-16 LAB — ANTIBODY SCREEN: ANTIBODY SCREEN: NEGATIVE

## 2013-06-18 LAB — HSV 2 ANTIBODY, IGG: HSV 2 Glycoprotein G Ab, IgG: 0.21 IV

## 2013-06-28 ENCOUNTER — Encounter: Payer: Self-pay | Admitting: Advanced Practice Midwife

## 2013-06-28 ENCOUNTER — Ambulatory Visit (INDEPENDENT_AMBULATORY_CARE_PROVIDER_SITE_OTHER): Payer: Medicaid Other | Admitting: Advanced Practice Midwife

## 2013-06-28 VITALS — BP 100/58 | Wt 215.5 lb

## 2013-06-28 DIAGNOSIS — Z1389 Encounter for screening for other disorder: Secondary | ICD-10-CM

## 2013-06-28 DIAGNOSIS — Z331 Pregnant state, incidental: Secondary | ICD-10-CM

## 2013-06-28 DIAGNOSIS — O2390 Unspecified genitourinary tract infection in pregnancy, unspecified trimester: Secondary | ICD-10-CM

## 2013-06-28 DIAGNOSIS — O99019 Anemia complicating pregnancy, unspecified trimester: Secondary | ICD-10-CM

## 2013-06-28 LAB — POCT URINALYSIS DIPSTICK
Blood, UA: NEGATIVE
GLUCOSE UA: NEGATIVE
Ketones, UA: NEGATIVE
Leukocytes, UA: NEGATIVE
Nitrite, UA: NEGATIVE
Protein, UA: NEGATIVE

## 2013-06-28 MED ORDER — FLUCONAZOLE 150 MG PO TABS: ORAL_TABLET | ORAL | Status: DC

## 2013-06-28 MED ORDER — FLUCONAZOLE 150 MG PO TABS
ORAL_TABLET | ORAL | Status: DC
Start: 2013-06-28 — End: 2013-10-16

## 2013-06-28 NOTE — Progress Notes (Signed)
W/i for 1 weeks of vaginal irritation/itching/discharge.  SSE:  Red vagina with d/c c/w yeast.  Diflucan 150 po now and in 3 days given

## 2013-06-28 NOTE — Addendum Note (Signed)
Addended by: Jacklyn ShellRESENZO-DISHMON, Zared Knoth on: 06/28/2013 11:24 AM   Modules accepted: Orders

## 2013-07-13 ENCOUNTER — Ambulatory Visit (INDEPENDENT_AMBULATORY_CARE_PROVIDER_SITE_OTHER): Payer: Medicaid Other | Admitting: Obstetrics & Gynecology

## 2013-07-13 ENCOUNTER — Encounter: Payer: Self-pay | Admitting: Obstetrics & Gynecology

## 2013-07-13 VITALS — BP 110/60 | Wt 219.0 lb

## 2013-07-13 DIAGNOSIS — Z1389 Encounter for screening for other disorder: Secondary | ICD-10-CM

## 2013-07-13 DIAGNOSIS — Z331 Pregnant state, incidental: Secondary | ICD-10-CM

## 2013-07-13 DIAGNOSIS — Z029 Encounter for administrative examinations, unspecified: Secondary | ICD-10-CM

## 2013-07-13 DIAGNOSIS — O99019 Anemia complicating pregnancy, unspecified trimester: Secondary | ICD-10-CM

## 2013-07-13 LAB — POCT URINALYSIS DIPSTICK
Blood, UA: NEGATIVE
GLUCOSE UA: NEGATIVE
KETONES UA: NEGATIVE
Nitrite, UA: NEGATIVE
PROTEIN UA: NEGATIVE

## 2013-07-13 NOTE — Progress Notes (Signed)
BP weight and urine results all reviewed and noted. Patient reports good fetal movement, denies any bleeding and no rupture of membranes symptoms or regular contractions. Patient is without complaints. All questions were answered.  

## 2013-07-20 ENCOUNTER — Telehealth: Payer: Self-pay | Admitting: Obstetrics and Gynecology

## 2013-07-20 NOTE — Telephone Encounter (Signed)
Pt c/o cough, cold, chills lightheadedness, light sensitivity, unsure if she has a fever by several days, however, pt states is feeling some better today. Pt encouraged to take OTC mucinex, push fluids, rest, lozenges if no improvement call office back to be seen. Pt verbalized understanding.

## 2013-07-27 ENCOUNTER — Ambulatory Visit (INDEPENDENT_AMBULATORY_CARE_PROVIDER_SITE_OTHER): Payer: Medicaid Other | Admitting: Obstetrics & Gynecology

## 2013-07-27 ENCOUNTER — Encounter: Payer: Self-pay | Admitting: Obstetrics & Gynecology

## 2013-07-27 VITALS — BP 90/60 | Wt 219.0 lb

## 2013-07-27 DIAGNOSIS — O99019 Anemia complicating pregnancy, unspecified trimester: Secondary | ICD-10-CM

## 2013-07-27 DIAGNOSIS — Z1389 Encounter for screening for other disorder: Secondary | ICD-10-CM

## 2013-07-27 DIAGNOSIS — Z3480 Encounter for supervision of other normal pregnancy, unspecified trimester: Secondary | ICD-10-CM

## 2013-07-27 DIAGNOSIS — Z331 Pregnant state, incidental: Secondary | ICD-10-CM

## 2013-07-27 LAB — POCT URINALYSIS DIPSTICK
Blood, UA: NEGATIVE
Glucose, UA: NEGATIVE
Ketones, UA: NEGATIVE
LEUKOCYTES UA: NEGATIVE
NITRITE UA: NEGATIVE

## 2013-07-27 NOTE — Progress Notes (Signed)
BP weight and urine results all reviewed and noted. Patient reports good fetal movement, denies any bleeding and no rupture of membranes symptoms or regular contractions. Patient is without complaints. All questions were answered.  

## 2013-07-27 NOTE — Addendum Note (Signed)
Addended by: Richardson ChiquitoRAVIS, Shawnya Mayor M on: 07/27/2013 09:37 AM   Modules accepted: Orders

## 2013-08-10 ENCOUNTER — Ambulatory Visit (INDEPENDENT_AMBULATORY_CARE_PROVIDER_SITE_OTHER): Payer: Medicaid Other | Admitting: Obstetrics and Gynecology

## 2013-08-10 ENCOUNTER — Encounter: Payer: Self-pay | Admitting: Obstetrics and Gynecology

## 2013-08-10 VITALS — BP 120/76 | Wt 221.0 lb

## 2013-08-10 DIAGNOSIS — O99019 Anemia complicating pregnancy, unspecified trimester: Secondary | ICD-10-CM

## 2013-08-10 DIAGNOSIS — Z331 Pregnant state, incidental: Secondary | ICD-10-CM

## 2013-08-10 DIAGNOSIS — Z1389 Encounter for screening for other disorder: Secondary | ICD-10-CM

## 2013-08-10 DIAGNOSIS — Z3490 Encounter for supervision of normal pregnancy, unspecified, unspecified trimester: Secondary | ICD-10-CM

## 2013-08-10 NOTE — Progress Notes (Signed)
Pt denies any problems or concerns at this time.  

## 2013-08-10 NOTE — Progress Notes (Signed)
668w1d. G2P1. Good FM. No complaints. Disability leave papers done.   This chart was scribed by Bennett Scrapehristina Taylor, Medical Scribe, for Dr. Christin BachJohn Kansas Spainhower on 08/10/13 at 9:01 AM. This chart was reviewed by Dr. Christin BachJohn Chantrell Apsey and is accurate.

## 2013-08-15 LAB — POCT URINALYSIS DIPSTICK
Glucose, UA: NEGATIVE
PROTEIN UA: NEGATIVE

## 2013-08-16 ENCOUNTER — Encounter: Payer: Medicaid Other | Admitting: Advanced Practice Midwife

## 2013-08-16 ENCOUNTER — Ambulatory Visit (INDEPENDENT_AMBULATORY_CARE_PROVIDER_SITE_OTHER): Payer: Medicaid Other | Admitting: Advanced Practice Midwife

## 2013-08-16 ENCOUNTER — Encounter (INDEPENDENT_AMBULATORY_CARE_PROVIDER_SITE_OTHER): Payer: Self-pay

## 2013-08-16 ENCOUNTER — Encounter: Payer: Self-pay | Admitting: Advanced Practice Midwife

## 2013-08-16 VITALS — BP 98/62 | Wt 218.0 lb

## 2013-08-16 DIAGNOSIS — O99019 Anemia complicating pregnancy, unspecified trimester: Secondary | ICD-10-CM

## 2013-08-16 DIAGNOSIS — O26849 Uterine size-date discrepancy, unspecified trimester: Secondary | ICD-10-CM

## 2013-08-16 DIAGNOSIS — Z1389 Encounter for screening for other disorder: Secondary | ICD-10-CM

## 2013-08-16 DIAGNOSIS — Z3480 Encounter for supervision of other normal pregnancy, unspecified trimester: Secondary | ICD-10-CM

## 2013-08-16 DIAGNOSIS — Z3490 Encounter for supervision of normal pregnancy, unspecified, unspecified trimester: Secondary | ICD-10-CM

## 2013-08-16 DIAGNOSIS — O9989 Other specified diseases and conditions complicating pregnancy, childbirth and the puerperium: Secondary | ICD-10-CM

## 2013-08-16 DIAGNOSIS — Z331 Pregnant state, incidental: Secondary | ICD-10-CM

## 2013-08-16 DIAGNOSIS — O99891 Other specified diseases and conditions complicating pregnancy: Secondary | ICD-10-CM

## 2013-08-16 LAB — POCT URINALYSIS DIPSTICK
Glucose, UA: NEGATIVE
Ketones, UA: NEGATIVE
Nitrite, UA: NEGATIVE
PROTEIN UA: NEGATIVE
RBC UA: NEGATIVE

## 2013-08-16 LAB — OB RESULTS CONSOLE GC/CHLAMYDIA
Chlamydia: NEGATIVE
Gonorrhea: NEGATIVE

## 2013-08-16 NOTE — Assessment & Plan Note (Signed)
  [facility]  [facility] Clavian [place]  Pap July 2014, nromal  GC/CT Initial:     -/-           36+wks:  Genetic Screen NT/IT: normal  Cystic fibrosis declined  Anatomic US normal  Flu vaccine declined  Tdap Recommended ~ 28wks  Glucose Screen  2 hr  77/122/100  GBS   Feed Preference Both start with breast  Contraception discussed  Circumcision Yes at hospital (knows it costs more)  Childbirth Classes no  Pediatrician [facility]

## 2013-08-16 NOTE — Progress Notes (Signed)
GBS collected No c/o at this time. Baby feels big.  Routine questions about pregnancy answered.  F/U in 1 weeks for EFW/AFI/Low-risk ob appt .

## 2013-08-17 LAB — GC/CHLAMYDIA PROBE AMP
CT PROBE, AMP APTIMA: NEGATIVE
GC Probe RNA: NEGATIVE

## 2013-08-18 LAB — STREP B DNA PROBE: GBSP: POSITIVE

## 2013-08-23 ENCOUNTER — Ambulatory Visit (INDEPENDENT_AMBULATORY_CARE_PROVIDER_SITE_OTHER): Payer: Medicaid Other | Admitting: Advanced Practice Midwife

## 2013-08-23 ENCOUNTER — Other Ambulatory Visit: Payer: Self-pay | Admitting: Advanced Practice Midwife

## 2013-08-23 ENCOUNTER — Ambulatory Visit (INDEPENDENT_AMBULATORY_CARE_PROVIDER_SITE_OTHER): Payer: Medicaid Other

## 2013-08-23 VITALS — BP 100/62 | Wt 223.0 lb

## 2013-08-23 DIAGNOSIS — Z3490 Encounter for supervision of normal pregnancy, unspecified, unspecified trimester: Secondary | ICD-10-CM

## 2013-08-23 DIAGNOSIS — O99019 Anemia complicating pregnancy, unspecified trimester: Secondary | ICD-10-CM

## 2013-08-23 DIAGNOSIS — O9989 Other specified diseases and conditions complicating pregnancy, childbirth and the puerperium: Secondary | ICD-10-CM

## 2013-08-23 DIAGNOSIS — O99891 Other specified diseases and conditions complicating pregnancy: Secondary | ICD-10-CM

## 2013-08-23 DIAGNOSIS — Z1389 Encounter for screening for other disorder: Secondary | ICD-10-CM

## 2013-08-23 DIAGNOSIS — O26849 Uterine size-date discrepancy, unspecified trimester: Secondary | ICD-10-CM

## 2013-08-23 DIAGNOSIS — Z331 Pregnant state, incidental: Secondary | ICD-10-CM

## 2013-08-23 LAB — POCT URINALYSIS DIPSTICK
Blood, UA: NEGATIVE
GLUCOSE UA: NEGATIVE
Ketones, UA: NEGATIVE
Leukocytes, UA: NEGATIVE
NITRITE UA: NEGATIVE
Protein, UA: NEGATIVE

## 2013-08-23 NOTE — Assessment & Plan Note (Addendum)
  [facility]  [facility] Cl[facility] Fantini  Pap 11/2011 normal  GC/CT Initial:      -/-          36+wks:-/-  Genetic Screen NT/IT: neg  CF screen declined  Anatomic US normal  Flu vaccine delcined  Tdap Recommended ~ 28wks  Glucose Screen  2 hr  77/122/100  GBS positive  Feed Preference   Contraception   Circumcision   Childbirth Classes   Pediatrician

## 2013-08-23 NOTE — Progress Notes (Signed)
U/S(38+0wks)-vtx active fetus, FHR- 127 BPM, "Sharon Lutz" EFW 7 lb 9 oz (66th%tile), fluid wnl AFI-11.3cm, posterior gr 3 placenta

## 2013-08-23 NOTE — Progress Notes (Signed)
EFw 66%, FLUID NORMAL.   No c/o at this time.  Routine questions about pregnancy answered.  F/U in 1 weeks for Low-risk ob appt .

## 2013-08-30 ENCOUNTER — Ambulatory Visit (INDEPENDENT_AMBULATORY_CARE_PROVIDER_SITE_OTHER): Payer: Medicaid Other | Admitting: Obstetrics & Gynecology

## 2013-08-30 ENCOUNTER — Encounter: Payer: Self-pay | Admitting: Obstetrics & Gynecology

## 2013-08-30 VITALS — BP 90/50 | Wt 224.0 lb

## 2013-08-30 DIAGNOSIS — O9989 Other specified diseases and conditions complicating pregnancy, childbirth and the puerperium: Secondary | ICD-10-CM

## 2013-08-30 DIAGNOSIS — Z331 Pregnant state, incidental: Secondary | ICD-10-CM

## 2013-08-30 DIAGNOSIS — O99891 Other specified diseases and conditions complicating pregnancy: Secondary | ICD-10-CM

## 2013-08-30 DIAGNOSIS — Z1389 Encounter for screening for other disorder: Secondary | ICD-10-CM

## 2013-08-30 DIAGNOSIS — O99019 Anemia complicating pregnancy, unspecified trimester: Secondary | ICD-10-CM

## 2013-08-30 LAB — POCT URINALYSIS DIPSTICK
Blood, UA: NEGATIVE
Glucose, UA: NEGATIVE
Ketones, UA: NEGATIVE
Nitrite, UA: NEGATIVE
PROTEIN UA: NEGATIVE

## 2013-08-30 NOTE — Progress Notes (Signed)
BP weight and urine results all reviewed and noted. Patient reports good fetal movement, denies any bleeding and no rupture of membranes symptoms or regular contractions. Patient is without complaints. All questions were answered.  

## 2013-08-31 ENCOUNTER — Encounter (HOSPITAL_COMMUNITY): Payer: Self-pay | Admitting: *Deleted

## 2013-08-31 ENCOUNTER — Inpatient Hospital Stay (HOSPITAL_COMMUNITY)
Admission: AD | Admit: 2013-08-31 | Discharge: 2013-09-03 | DRG: 774 | Disposition: A | Discharge status: Home / Self Care | Payer: Medicaid Other | Source: Ambulatory Visit | Attending: Obstetrics & Gynecology | Admitting: Obstetrics & Gynecology

## 2013-08-31 DIAGNOSIS — Z22330 Carrier of Group B streptococcus: Secondary | ICD-10-CM

## 2013-08-31 DIAGNOSIS — O4290 Premature rupture of membranes, unspecified as to length of time between rupture and onset of labor, unspecified weeks of gestation: Principal | ICD-10-CM | POA: Diagnosis present

## 2013-08-31 DIAGNOSIS — Z6833 Body mass index (BMI) 33.0-33.9, adult: Secondary | ICD-10-CM

## 2013-08-31 DIAGNOSIS — O99214 Obesity complicating childbirth: Secondary | ICD-10-CM

## 2013-08-31 DIAGNOSIS — O9912 Other diseases of the blood and blood-forming organs and certain disorders involving the immune mechanism complicating childbirth: Secondary | ICD-10-CM

## 2013-08-31 DIAGNOSIS — E669 Obesity, unspecified: Secondary | ICD-10-CM | POA: Diagnosis present

## 2013-08-31 DIAGNOSIS — O9989 Other specified diseases and conditions complicating pregnancy, childbirth and the puerperium: Secondary | ICD-10-CM

## 2013-08-31 DIAGNOSIS — D689 Coagulation defect, unspecified: Secondary | ICD-10-CM | POA: Diagnosis present

## 2013-08-31 DIAGNOSIS — O9902 Anemia complicating childbirth: Secondary | ICD-10-CM | POA: Diagnosis present

## 2013-08-31 DIAGNOSIS — D649 Anemia, unspecified: Secondary | ICD-10-CM | POA: Diagnosis present

## 2013-08-31 DIAGNOSIS — R110 Nausea: Secondary | ICD-10-CM

## 2013-08-31 DIAGNOSIS — D696 Thrombocytopenia, unspecified: Secondary | ICD-10-CM | POA: Diagnosis present

## 2013-08-31 DIAGNOSIS — O99892 Other specified diseases and conditions complicating childbirth: Secondary | ICD-10-CM | POA: Diagnosis present

## 2013-08-31 DIAGNOSIS — Z3490 Encounter for supervision of normal pregnancy, unspecified, unspecified trimester: Secondary | ICD-10-CM

## 2013-08-31 NOTE — MAU Note (Signed)
Leaking fld since 1800. Small amt leaking continuously. Clear fld. Having some cramping. 3cm last ck

## 2013-08-31 NOTE — MAU Note (Signed)
PT SAYS ON  Thursday  - STRIPPED MEMBRANES  IN OFFICE - VE 3 CM.   DENIES HSV AND MRSA.    THINKS SROM AT 530-  SLIGHT LEAK.

## 2013-09-01 ENCOUNTER — Encounter (HOSPITAL_COMMUNITY): Payer: Self-pay | Admitting: *Deleted

## 2013-09-01 ENCOUNTER — Inpatient Hospital Stay (HOSPITAL_COMMUNITY): Payer: Medicaid Other | Admitting: Anesthesiology

## 2013-09-01 ENCOUNTER — Encounter (HOSPITAL_COMMUNITY): Payer: Medicaid Other | Admitting: Anesthesiology

## 2013-09-01 DIAGNOSIS — O4290 Premature rupture of membranes, unspecified as to length of time between rupture and onset of labor, unspecified weeks of gestation: Secondary | ICD-10-CM

## 2013-09-01 DIAGNOSIS — O9912 Other diseases of the blood and blood-forming organs and certain disorders involving the immune mechanism complicating childbirth: Secondary | ICD-10-CM

## 2013-09-01 DIAGNOSIS — D696 Thrombocytopenia, unspecified: Secondary | ICD-10-CM

## 2013-09-01 DIAGNOSIS — D689 Coagulation defect, unspecified: Secondary | ICD-10-CM

## 2013-09-01 DIAGNOSIS — Z3490 Encounter for supervision of normal pregnancy, unspecified, unspecified trimester: Secondary | ICD-10-CM

## 2013-09-01 LAB — CBC
HCT: 31.9 % — ABNORMAL LOW (ref 36.0–46.0)
HCT: 28.7 % — ABNORMAL LOW (ref 36.0–46.0)
HEMATOCRIT: 34.8 % — AB (ref 36.0–46.0)
Hemoglobin: 11.8 g/dL — ABNORMAL LOW (ref 12.0–15.0)
Hemoglobin: 10.6 g/dL — ABNORMAL LOW (ref 12.0–15.0)
Hemoglobin: 9.6 g/dL — ABNORMAL LOW (ref 12.0–15.0)
MCH: 29.5 pg (ref 26.0–34.0)
MCH: 29.1 pg (ref 26.0–34.0)
MCH: 29.2 pg (ref 26.0–34.0)
MCHC: 33.9 g/dL (ref 30.0–36.0)
MCHC: 33.2 g/dL (ref 30.0–36.0)
MCHC: 33.4 g/dL (ref 30.0–36.0)
MCV: 87 fL (ref 78.0–100.0)
MCV: 87.2 fL (ref 78.0–100.0)
MCV: 87.6 fL (ref 78.0–100.0)
PLATELETS: 119 10*3/uL — AB (ref 150–400)
Platelets: 118 10*3/uL — ABNORMAL LOW (ref 150–400)
Platelets: 131 10*3/uL — ABNORMAL LOW (ref 150–400)
RBC: 3.29 MIL/uL — ABNORMAL LOW (ref 3.87–5.11)
RBC: 3.64 MIL/uL — ABNORMAL LOW (ref 3.87–5.11)
RBC: 4 MIL/uL (ref 3.87–5.11)
RDW: 13.5 % (ref 11.5–15.5)
RDW: 13.5 % (ref 11.5–15.5)
RDW: 13.5 % (ref 11.5–15.5)
WBC: 8.6 10*3/uL (ref 4.0–10.5)
WBC: 14.8 10*3/uL — ABNORMAL HIGH (ref 4.0–10.5)
WBC: 12.4 10*3/uL — ABNORMAL HIGH (ref 4.0–10.5)

## 2013-09-01 LAB — ABO/RH: ABO/RH(D): O POS

## 2013-09-01 LAB — PREPARE RBC (CROSSMATCH)

## 2013-09-01 LAB — POSTPARTUM HEMORRHAGE PROTOCOL (BB NOTIFICATION)

## 2013-09-01 LAB — RPR

## 2013-09-01 MED ORDER — LACTATED RINGERS IV SOLN
INTRAVENOUS | Status: DC
  Administered 2013-09-01: 04:00:00 via INTRAVENOUS

## 2013-09-01 MED ORDER — DIPHENHYDRAMINE HCL 50 MG/ML IJ SOLN: 12.5000 mg | INTRAMUSCULAR | Status: DC | PRN

## 2013-09-01 MED ORDER — SIMETHICONE 80 MG PO CHEW: 80.0000 mg | CHEWABLE_TABLET | ORAL | Status: DC | PRN

## 2013-09-01 MED ORDER — FENTANYL 2.5 MCG/ML BUPIVACAINE 1/10 % EPIDURAL INFUSION (WH - ANES)
INTRAMUSCULAR | Status: DC | PRN
  Administered 2013-09-01: 14 mL/h via EPIDURAL

## 2013-09-01 MED ORDER — EPHEDRINE 5 MG/ML INJ
10.0000 mg | INTRAVENOUS | Status: DC | PRN
  Filled 2013-09-01: qty 4

## 2013-09-01 MED ORDER — OXYTOCIN 40 UNITS IN LACTATED RINGERS INFUSION - SIMPLE MED
62.5000 mL/h | INTRAVENOUS | Status: DC
  Filled 2013-09-01: qty 1000

## 2013-09-01 MED ORDER — MEASLES, MUMPS & RUBELLA VAC ~~LOC~~ INJ: 0.5000 mL | INJECTION | Freq: Once | SUBCUTANEOUS | Status: DC

## 2013-09-01 MED ORDER — LANOLIN HYDROUS EX OINT: TOPICAL_OINTMENT | CUTANEOUS | Status: DC | PRN

## 2013-09-01 MED ORDER — METHYLERGONOVINE MALEATE 0.2 MG PO TABS: 0.2000 mg | ORAL_TABLET | ORAL | Status: DC | PRN

## 2013-09-01 MED ORDER — OXYTOCIN 40 UNITS IN LACTATED RINGERS INFUSION - SIMPLE MED
1.0000 m[IU]/min | INTRAVENOUS | Status: DC
  Administered 2013-09-01: 2 m[IU]/min via INTRAVENOUS
  Filled 2013-09-01: qty 1000

## 2013-09-01 MED ORDER — ONDANSETRON HCL 4 MG/2ML IJ SOLN: 4.0000 mg | INTRAMUSCULAR | Status: DC | PRN

## 2013-09-01 MED ORDER — ACETAMINOPHEN 325 MG PO TABS: 650.0000 mg | ORAL_TABLET | ORAL | Status: DC | PRN

## 2013-09-01 MED ORDER — WITCH HAZEL-GLYCERIN EX PADS: 1.0000 "application " | MEDICATED_PAD | CUTANEOUS | Status: DC | PRN

## 2013-09-01 MED ORDER — OXYTOCIN 40 UNITS IN LACTATED RINGERS INFUSION - SIMPLE MED
250.0000 mL/h | INTRAVENOUS | Status: DC
  Administered 2013-09-01: 250 mL/h via INTRAVENOUS

## 2013-09-01 MED ORDER — PHENYLEPHRINE 40 MCG/ML (10ML) SYRINGE FOR IV PUSH (FOR BLOOD PRESSURE SUPPORT)
80.0000 ug | PREFILLED_SYRINGE | INTRAVENOUS | Status: DC | PRN
  Filled 2013-09-01: qty 10

## 2013-09-01 MED ORDER — OXYCODONE-ACETAMINOPHEN 5-325 MG PO TABS: 1.0000 | ORAL_TABLET | ORAL | Status: DC | PRN

## 2013-09-01 MED ORDER — MISOPROSTOL 200 MCG PO TABS
1000.0000 ug | ORAL_TABLET | Freq: Once | ORAL | Status: AC
  Administered 2013-09-01: 1000 ug via RECTAL

## 2013-09-01 MED ORDER — BENZOCAINE-MENTHOL 20-0.5 % EX AERO
1.0000 "application " | INHALATION_SPRAY | CUTANEOUS | Status: DC | PRN
  Administered 2013-09-01: 1 via TOPICAL
  Filled 2013-09-01 (×2): qty 56

## 2013-09-01 MED ORDER — PENICILLIN G POTASSIUM 5000000 UNITS IJ SOLR
5.0000 10*6.[IU] | Freq: Once | INTRAMUSCULAR | Status: AC
  Administered 2013-09-01: 5 10*6.[IU] via INTRAVENOUS
  Filled 2013-09-01: qty 5

## 2013-09-01 MED ORDER — ZOLPIDEM TARTRATE 5 MG PO TABS: 5.0000 mg | ORAL_TABLET | Freq: Every evening | ORAL | Status: DC | PRN

## 2013-09-01 MED ORDER — METHYLERGONOVINE MALEATE 0.2 MG/ML IJ SOLN
0.2000 mg | INTRAMUSCULAR | Status: DC | PRN
  Administered 2013-09-01: 0.2 mg via INTRAMUSCULAR

## 2013-09-01 MED ORDER — IBUPROFEN 600 MG PO TABS
600.0000 mg | ORAL_TABLET | Freq: Four times a day (QID) | ORAL | Status: DC | PRN
  Administered 2013-09-01: 600 mg via ORAL
  Filled 2013-09-01: qty 1

## 2013-09-01 MED ORDER — AMPICILLIN-SULBACTAM SODIUM 3 (2-1) G IJ SOLR
3.0000 g | Freq: Four times a day (QID) | INTRAMUSCULAR | Status: AC
  Administered 2013-09-01: 3 g via INTRAVENOUS
  Filled 2013-09-01: qty 3

## 2013-09-01 MED ORDER — TETANUS-DIPHTH-ACELL PERTUSSIS 5-2.5-18.5 LF-MCG/0.5 IM SUSP: 0.5000 mL | Freq: Once | INTRAMUSCULAR | Status: DC

## 2013-09-01 MED ORDER — LACTATED RINGERS IV SOLN
500.0000 mL | Freq: Once | INTRAVENOUS | Status: AC
  Administered 2013-09-01: 500 mL via INTRAVENOUS

## 2013-09-01 MED ORDER — FLEET ENEMA 7-19 GM/118ML RE ENEM: 1.0000 | ENEMA | RECTAL | Status: DC | PRN

## 2013-09-01 MED ORDER — ONDANSETRON HCL 4 MG/2ML IJ SOLN: 4.0000 mg | Freq: Four times a day (QID) | INTRAMUSCULAR | Status: DC | PRN

## 2013-09-01 MED ORDER — PENICILLIN G POTASSIUM 5000000 UNITS IJ SOLR
2.5000 10*6.[IU] | INTRAVENOUS | Status: DC
  Administered 2013-09-01: 2.5 10*6.[IU] via INTRAVENOUS
  Filled 2013-09-01 (×4): qty 2.5

## 2013-09-01 MED ORDER — EPHEDRINE 5 MG/ML INJ: 10.0000 mg | INTRAVENOUS | Status: DC | PRN

## 2013-09-01 MED ORDER — PHENYLEPHRINE 40 MCG/ML (10ML) SYRINGE FOR IV PUSH (FOR BLOOD PRESSURE SUPPORT): 80.0000 ug | PREFILLED_SYRINGE | INTRAVENOUS | Status: DC | PRN

## 2013-09-01 MED ORDER — CARBOPROST TROMETHAMINE 250 MCG/ML IM SOLN: 250.0000 ug | INTRAMUSCULAR | Status: DC | PRN

## 2013-09-01 MED ORDER — CITRIC ACID-SODIUM CITRATE 334-500 MG/5ML PO SOLN: 30.0000 mL | ORAL | Status: DC | PRN

## 2013-09-01 MED ORDER — SENNOSIDES-DOCUSATE SODIUM 8.6-50 MG PO TABS
2.0000 | ORAL_TABLET | ORAL | Status: DC
  Administered 2013-09-02 – 2013-09-03 (×2): 2 via ORAL
  Filled 2013-09-01 (×2): qty 2

## 2013-09-01 MED ORDER — LIDOCAINE HCL (PF) 1 % IJ SOLN
INTRAMUSCULAR | Status: DC | PRN
  Administered 2013-09-01 (×2): 4 mL

## 2013-09-01 MED ORDER — LACTATED RINGERS IV SOLN: 500.0000 mL | INTRAVENOUS | Status: DC | PRN

## 2013-09-01 MED ORDER — DIBUCAINE 1 % RE OINT
1.0000 "application " | TOPICAL_OINTMENT | RECTAL | Status: DC | PRN
  Filled 2013-09-01: qty 28

## 2013-09-01 MED ORDER — SODIUM CHLORIDE 0.9 % IV SOLN
3.0000 g | Freq: Four times a day (QID) | INTRAVENOUS | Status: DC
  Administered 2013-09-01: 3 g via INTRAVENOUS
  Filled 2013-09-01 (×4): qty 3

## 2013-09-01 MED ORDER — LIDOCAINE HCL (PF) 1 % IJ SOLN
30.0000 mL | INTRAMUSCULAR | Status: DC | PRN
  Filled 2013-09-01: qty 30

## 2013-09-01 MED ORDER — ONDANSETRON HCL 4 MG PO TABS
4.0000 mg | ORAL_TABLET | ORAL | Status: DC | PRN
Start: 2013-09-01 — End: 2013-09-03

## 2013-09-01 MED ORDER — DIPHENHYDRAMINE HCL 25 MG PO CAPS: 25.0000 mg | ORAL_CAPSULE | Freq: Four times a day (QID) | ORAL | Status: DC | PRN

## 2013-09-01 MED ORDER — MISOPROSTOL 200 MCG PO TABS
ORAL_TABLET | ORAL | Status: AC
  Filled 2013-09-01: qty 5

## 2013-09-01 MED ORDER — PRENATAL MULTIVITAMIN CH
1.0000 | ORAL_TABLET | Freq: Every day | ORAL | Status: DC
  Administered 2013-09-02: 1 via ORAL
  Filled 2013-09-01: qty 1

## 2013-09-01 MED ORDER — IBUPROFEN 600 MG PO TABS
600.0000 mg | ORAL_TABLET | Freq: Four times a day (QID) | ORAL | Status: DC
  Administered 2013-09-01 – 2013-09-03 (×7): 600 mg via ORAL
  Filled 2013-09-01 (×7): qty 1

## 2013-09-01 MED ORDER — TERBUTALINE SULFATE 1 MG/ML IJ SOLN: 0.2500 mg | Freq: Once | INTRAMUSCULAR | Status: DC | PRN

## 2013-09-01 MED ORDER — OXYTOCIN BOLUS FROM INFUSION
500.0000 mL | INTRAVENOUS | Status: DC
  Administered 2013-09-01: 500 mL via INTRAVENOUS

## 2013-09-01 MED ORDER — METHYLERGONOVINE MALEATE 0.2 MG/ML IJ SOLN
INTRAMUSCULAR | Status: AC
  Filled 2013-09-01: qty 1

## 2013-09-01 MED ORDER — FENTANYL 2.5 MCG/ML BUPIVACAINE 1/10 % EPIDURAL INFUSION (WH - ANES)
14.0000 mL/h | INTRAMUSCULAR | Status: DC | PRN
Start: 2013-09-01 — End: 2013-09-01
  Filled 2013-09-01: qty 125

## 2013-09-01 NOTE — Progress Notes (Signed)
Sharon Lutz is a 28 y.o. G2P1001 at 5885w2d admitted for PROM  Subjective: Epidural placed and pitocin started. Pt comfortable. No complaints.   Objective: BP 109/75  Pulse 89  Temp(Src) 98.3 F (36.8 C) (Oral)  Resp 18  Ht 5\' 9"  (1.753 m)  Wt 102.331 kg (225 lb 9.6 oz)  BMI 33.30 kg/m2  SpO2 97%  LMP 11/30/2012      FHT:  FHR: 135 bpm, variability: moderate,  accelerations:  Present,  decelerations:  Absent UC:   irregular, every 2-6 minutes SVE:   Dilation: 3.5 Effacement (%): 60 Station: -2 Exam by:: Lilli Few. Blackstock, RN  Labs: Lab Results  Component Value Date   WBC 8.6 09/01/2013   HGB 11.8* 09/01/2013   HCT 34.8* 09/01/2013   MCV 87.0 09/01/2013   PLT 118* 09/01/2013    Assessment / Plan: PROM being augmented with pitocin   Labor: slowly increase pit Fetal Wellbeing:  Category I Pain Control:  Epidural I/D:  gbs + and on PCN Anticipated MOD:  NSVD  Mykai Wendorf L Sumaya Riedesel 09/01/2013, 2:46 AM

## 2013-09-01 NOTE — H&P (Signed)
Attestation of Attending Supervision of Obstetric Fellow: Evaluation and management procedures were performed by the Obstetric Fellow under my supervision and collaboration.  I have reviewed the Obstetric Fellow's note and chart, and I agree with the management and plan.  Diannie Willner, DO Attending Physician Faculty Practice, [facility]   

## 2013-09-01 NOTE — Anesthesia Postprocedure Evaluation (Signed)
  Anesthesia Post-op Note  Patient: Sharon Lutz  Procedure(s) Performed: * No procedures listed *  Patient Location: Mother/Baby  Anesthesia Type:Epidural  Level of Consciousness: awake and alert   Airway and Oxygen Therapy: Patient Spontanous Breathing  Post-op Pain: mild  Post-op Assessment: Post-op Vital signs reviewed, Patient's Cardiovascular Status Stable, Respiratory Function Stable, No signs of Nausea or vomiting, Pain level controlled, No headache, No residual numbness and No residual motor weakness  Post-op Vital Signs: Reviewed and stable  Last Vitals:  Filed Vitals:   09/01/13 1254  BP: 106/68  Pulse: 97  Temp: 37.3 C  Resp: 20    Complications: No apparent anesthesia complications

## 2013-09-01 NOTE — Anesthesia Preprocedure Evaluation (Addendum)
Anesthesia Evaluation  Patient identified by MRN, date of birth, ID band Patient awake    Reviewed: Allergy & Precautions, H&P , Patient's Chart, lab work & pertinent test results  Airway Mallampati: II TM Distance: >3 FB Neck ROM: Full    Dental no notable dental hx. (+) Teeth Intact   Pulmonary neg pulmonary ROS,  breath sounds clear to auscultation  Pulmonary exam normal       Cardiovascular negative cardio ROS  Rhythm:Regular Rate:Normal     Neuro/Psych negative neurological ROS  negative psych ROS   GI/Hepatic Neg liver ROS, Nausea   Endo/Other  Obesity  Renal/GU negative Renal ROS  negative genitourinary   Musculoskeletal   Abdominal (+) + obese,   Peds  Hematology  (+) anemia , REFUSES BLOOD PRODUCTS, Thrombocytopenia   Anesthesia Other Findings   Reproductive/Obstetrics (+) Pregnancy                          Anesthesia Physical Anesthesia Plan  ASA: II  Anesthesia Plan: Epidural   Post-op Pain Management:    Induction:   Airway Management Planned: Natural Airway  Additional Equipment:   Intra-op Plan:   Post-operative Plan:   Informed Consent: I have reviewed the patients History and Physical, chart, labs and discussed the procedure including the risks, benefits and alternatives for the proposed anesthesia with the patient or authorized representative who has indicated his/her understanding and acceptance.     Plan Discussed with: Anesthesiologist  Anesthesia Plan Comments:         Anesthesia Quick Evaluation

## 2013-09-01 NOTE — Progress Notes (Signed)
Patient ID: Sharon Lutz, female   DOB: Nov 26, 1985, 28 y.o.   MRN: [id number] Bleeding scant. FF. Will repeat CBC at 1600.   ChinchillaVirginia Collins Kerby, CNM 09/01/2013 10:49 AM

## 2013-09-01 NOTE — Lactation Note (Signed)
This note was copied from the chart of Sharon Elwin MochaDevonne Maddux. Lactation Consultation Note Initial consult:  P2 First time breastfeeding.   Assisted mother to place baby in football hold, latched with teacup hold.  Massaged baby's jaws. Reviewed hand expression and drops expressed. Reviewed basics, LC/OP services and brochure. Encouraged mother to call for further assistance.  Patient Name: Sharon Lutz: 09/01/2013 Reason for consult: Initial assessment   Maternal Data Infant to breast within first hour of birth: Yes Has patient been taught Hand Expression?: Yes  Feeding Feeding Type: Breast Fed Length of feed: 15 min (off on on)  LATCH Score/Interventions Latch: Repeated attempts needed to sustain latch, nipple held in mouth throughout feeding, stimulation needed to elicit sucking reflex. Intervention(s): Skin to skin Intervention(s): Breast massage;Breast compression;Assist with latch;Adjust position  Audible Swallowing: A few with stimulation  Type of Nipple: Everted at rest and after stimulation  Comfort (Breast/Nipple): Soft / non-tender     Hold (Positioning): Assistance needed to correctly position infant at breast and maintain latch.  LATCH Score: 7  Lactation Tools Discussed/Used     Consult Status Consult Status: Follow-up Lutz: 09/02/13 Follow-up type: In-patient    Dulce SellarRuth Boschen Berkelhammer 09/01/2013, 3:36 PM

## 2013-09-01 NOTE — Progress Notes (Addendum)
Patient ID: Sharon MolderDevonne R Rueter, female   DOB: 06/05/1985, 28 y.o.   MRN: [id number] CNM notified of increased bleeding, passage of large clots. Rn I&O cathed pt. FF, bleeding improved significantly. Cytotec ordered. CNM in delivery. Stage 1 PPH initiated.   RN called CNM second time about bleeding. RN I&O cathed pt for 150 ml. Methergine IM. Fundus slightly boggy, 1/U. CNM expressed moderate clots x 2. FF @U . Uterine exploration performed. Uterine cavity abnormal feeling, curved to maternal left. Able to reach all the way to fundus.  Large clot and 3x6 fragment of adherent placental fragment removed w/ difficulty. On repeat sweep no more placenta or clots palpated. FF U/3. Bleeding scant. Pt well-appearing, mild palor. No dizziness while sitting upright.   Patient Vitals for the past 24 hrs:  BP Temp Temp src Pulse Resp SpO2 Height Weight  09/01/13 0931 104/59 mmHg 98.8 F (37.1 C) Oral 132 20 - - -  09/01/13 0925 110/77 mmHg - - 116 - - - -  09/01/13 0914 - - - 128 - - - -  09/01/13 0910 110/65 mmHg - - 125 - - - -  09/01/13 0909 - - - 245 - - - -  09/01/13 0906 - - - 139 - - - -  09/01/13 0901 106/79 mmHg - - 107 - - - -  09/01/13 0900 - - - 97 - - - -  09/01/13 0856 - - - 104 - - - -  09/01/13 [id number] - - - 25 - - - -  09/01/13 0850 114/74 mmHg - - 103 - - - -  09/01/13 0845 101/53 mmHg - - 107 - - - -  09/01/13 0831 121/59 mmHg - - 128 - - - -  09/01/13 0815 127/80 mmHg - - 113 20 - - -  09/01/13 0801 114/69 mmHg - - 114 20 - - -  09/01/13 0731 103/73 mmHg 98.1 F (36.7 C) Oral 100 20 - - -  09/01/13 0700 118/81 mmHg - - 97 20 - - -   Unasyn, prepare 2 Units RBCs, Bolus pit. NPO. Foley cath. I&O. Anesthesia, House coverage, Dr. Macon LargeAnyanwu notified. [facility] status.   [place], [place] 09/01/2013 9:37 AM

## 2013-09-01 NOTE — H&P (Signed)
LABOR ADMISSION HISTORY AND PHYSICAL   Neville RouteDevonne R Thamas JaegersWinchester is a 28 y.o. female G2P1001 with IUP at 5044w2d by  presenting for LOF. States she was sitting on the couch at 1730 and had a gush of fluid that soaked through her pants and onto the couch. Then she has continually leaked since then.  Had one more large gush just prior to coming in.    No vb. Rare ctx. +FM.   [facility] Normal 1st trimester screen. Normal US. GBS positive.    Prenatal History/Complications:  Past Medical History: Past Medical History  Diagnosis Date  . Nausea 01/31/2013  . Pregnant 01/31/2013    Past Surgical History: History reviewed. No pertinent past surgical history.  Obstetrical History: OB History   Grav Para Term Preterm Abortions TAB SAB Ect Mult Living   2 1 1       1      G1- NSVD, 7lb9oz    Social History: History   Social History  . Marital Status: Married    Spouse Name: N/A    Number of Children: N/A  . Years of Education: N/A   Social History Main Topics  . Smoking status: Never Smoker   . Smokeless tobacco: Never Used  . Alcohol Use: No  . Drug Use: No  . Sexual Activity: Yes    Birth Control/ Protection: None   Other Topics Concern  . None   Social History Narrative  . None    Family History: Family History  Problem Relation Age of Onset  . Diabetes Maternal Grandmother   . Diabetes Maternal Aunt   . Diabetes Maternal Uncle     Allergies: No Known Allergies  Prescriptions prior to admission  Medication Sig Dispense Refill  . prenatal vitamin w/FE, FA (PRENATAL 1 + 1) 27-1 MG TABS tablet Take 1 tablet by mouth daily at 12 noon.  30 each  11  . fluconazole (DIFLUCAN) 150 MG tablet 1 po stat; repeat in 3 days  2 tablet  2  . fluconazole (DIFLUCAN) 150 MG tablet 1 po stat; repeat in 3 days  2 tablet  2     Review of Systems  All systems reviewed and negative except as stated in HPI  Blood pressure 112/73, pulse 91, temperature 98.5 F (36.9  C), resp. rate 18, height 5\' 9"  (1.753 m), weight 102.331 kg (225 lb 9.6 oz), last menstrual period 11/30/2012. General appearance: alert, cooperative and no distress Lungs: clear to auscultation bilaterally Heart: regular rate and rhythm Abdomen: soft, non-tender; bowel sounds normal Extremities: Homans sign is negative, no sign of DVT  Presentation: cephalic Fetal monitoringBaseline: 130 bpm, Variability: Good {> 6 bpm), Accelerations: Reactive and Decelerations: Absent Uterine activity occasional irregular   Dilation: 3.5 Effacement (%): 60 Station: -2 Exam by:: DR Lillie Bollig   Prenatal labs: ABO, Rh: O/POS/-- (09/10 1030) Antibody: NEG (01/23 0943) Rubella:   RPR: NON REAC (01/23 0943)  HBsAg: NEGATIVE (09/10 1030)  HIV: NON REACTIVE (01/23 0943)  GBS: POSITIVE (03/26 1205)  2 hr Glucola normal   Genetic screening  1st trimester screen  Anatomy US normal     Assessment: Louellen MolderDevonne R Mccrystal is a 28 y.o. G2P1001 with an IUP at 6244w2d presenting for PROM with positive fern on the perineum.   Plan: 1) Admit to L&D - routine orders -epidural prn  2) PROM - will plan to start pitocin once on the floor  3) FWB - cat I tracing - GBS pos. Will  start PCN - EFW 8lb   4) anticipate SVD    Vale Haven, MD 09/01/2013, 12:33 AM

## 2013-09-02 NOTE — Progress Notes (Addendum)
Post Partum Day #1 Subjective: no complaints, up ad lib and tolerating PO; breastfeeding going slow- added bottle last PM; denies dizziness or s/s anemia  Objective: Blood pressure 112/74, pulse 101, temperature 98.2 F (36.8 C), temperature source Axillary, resp. rate 20, height 5\' 9"  (1.753 m), weight 102.331 kg (225 lb 9.6 oz), last menstrual period 11/30/2012, SpO2 97.00%, unknown if currently breastfeeding.  Physical Exam:  General: alert, cooperative and no distress Lochia: appropriate Uterine Fundus: firm DVT Evaluation: No evidence of DVT seen on physical exam.   Recent Labs  09/01/13 0920 09/01/13 1557  HGB 10.6* 9.6*  HCT 31.9* 28.7*    Assessment/Plan: Plan for discharge tomorrow, Lactation consult and Contraception : Nuvaring once breastfeeding established GBS positive- infant will need obs until tomorrow   LOS: 2 days   Sharon Lutz 09/02/2013, 7:15 AM

## 2013-09-02 NOTE — Lactation Note (Signed)
This note was copied from the chart of Boy Elwin MochaDevonne Veno. Lactation Consultation Note  Patient Name: Boy Elwin MochaDevonne Zweig [id number]'WToday's Date: 09/02/2013 Reason for consult: Follow-up assessment of this mom and baby at 37 hours post-delivery.  Mom states she has only pumped once and that baby is latching well tonight.  Mom declined need for assistance.  LC encouraged cue feedings but mom to call for help as needed and to pump, if baby not latching well or needs supplement.   Maternal Data    Feeding    LATCH Score/Interventions           most recent LATCH score=8 (assisted by LC at earlier feeding today)           Lactation Tools Discussed/Used   Cue feedings at breast DEBP as needed, if baby not latching well  Consult Status Consult Status: Follow-up Date: 09/03/13 Follow-up type: In-patient    Zara ChessJoanne P Jeweldean Drohan 09/02/2013, 9:04 PM

## 2013-09-03 ENCOUNTER — Ambulatory Visit: Payer: Self-pay

## 2013-09-03 MED ORDER — IBUPROFEN 600 MG PO TABS: 600.0000 mg | ORAL_TABLET | Freq: Four times a day (QID) | ORAL | Status: DC

## 2013-09-03 MED ORDER — OXYCODONE-ACETAMINOPHEN 5-325 MG PO TABS: 1.0000 | ORAL_TABLET | ORAL | Status: DC | PRN

## 2013-09-03 NOTE — Discharge Summary (Signed)
Obstetric Discharge Summary Reason for Admission: onset of labor Prenatal Procedures: ultrasound Intrapartum Procedures: spontaneous vaginal delivery Postpartum Procedures: none Complications-Operative and Postpartum: none Hemoglobin  Date Value Ref Range Status  09/01/2013 9.6* 12.0 - 15.0 g/dL Final     HCT  Date Value Ref Range Status  09/01/2013 28.7* 36.0 - 46.0 % Final    Physical Exam:  General: alert, cooperative, appears stated age and no distress Lochia: appropriate Uterine Fundus: firm Incision: n/a DVT Evaluation: No evidence of DVT seen on physical exam. Negative Homan's sign. No cords or calf tenderness. No significant calf/ankle edema.  Discharge Diagnoses: Term Pregnancy-delivered  Discharge Information: Date: 09/03/2013 Activity: pelvic rest Diet: routine Medications: PNV, Ibuprofen and Percocet Condition: stable and improved Instructions: refer to practice specific booklet Discharge to: home   Newborn Data: Live born female  Birth Weight: 8 lb 14 oz (4026 g) APGAR: 9, 10  Home with mother.  Sharon PingMarie Darlene Lutz 09/03/2013, 6:36 AM

## 2013-09-03 NOTE — Discharge Summary (Signed)
Obstetric Discharge Summary Reason for Admission: onset of labor Prenatal Procedures: ultrasound Intrapartum Procedures: spontaneous vaginal delivery Postpartum Procedures: none Complications-Operative and Postpartum: none Hemoglobin  Date Value Ref Range Status  09/01/2013 9.6* 12.0 - 15.0 g/dL Final     HCT  Date Value Ref Range Status  09/01/2013 28.7* 36.0 - 46.0 % Final    Physical Exam:  General: alert, cooperative, appears stated age and no distress Lochia: appropriate Uterine Fundus: firm Incision: n/a DVT Evaluation: No evidence of DVT seen on physical exam. Negative Homan's sign. No cords or calf tenderness. No significant calf/ankle edema.  Discharge Diagnoses: Term Pregnancy-delivered  Discharge Information: Date: 09/03/2013 Activity: pelvic rest Diet: routine Medications: PNV, Ibuprofen and Percocet Condition: stable and improved Instructions: refer to practice specific booklet Discharge to: home   Newborn Data: Live born female  Birth Weight: 8 lb 14 oz (4026 g) APGAR: 9, 10  Home with mother.  Sharon PingMarie Darlene Amalia Lutz 09/03/2013, 6:52 AM

## 2013-09-03 NOTE — Lactation Note (Addendum)
This note was copied from the chart of Sharon Elwin MochaDevonne Stoffel. Lactation Consultation Note  Follow up consult:  Mother has decided to pump breastmilk and give to baby in bottle and supplement with formula. Baby has had a difficult time sustaining latch.  Offered to assist mother but mother uninterested. Mother has [employer] electric breastpump at home. Mother has volume guidelines.  Reviewed engorgement care and encouraged mother to call if further assistance is needed. Patient Name: Sharon Lutz ZOXWR'UToday's Date: 09/03/2013 Reason for consult: Follow-up assessment   Maternal Data    Feeding Feeding Type: Bottle Fed - Formula  LATCH Score/Interventions                      Lactation Tools Discussed/Used     Consult Status Consult Status: Complete    Hardie PulleyRuth Boschen Berkelhammer 09/03/2013, 11:57 AM

## 2013-09-03 NOTE — Progress Notes (Signed)
Post discharge ur review completed. 

## 2013-09-05 LAB — TYPE AND SCREEN
ABO/RH(D): O POS
ANTIBODY SCREEN: NEGATIVE
UNIT DIVISION: 0
Unit division: 0

## 2013-09-06 ENCOUNTER — Telehealth: Payer: Self-pay | Admitting: *Deleted

## 2013-09-06 ENCOUNTER — Encounter: Payer: Medicaid Other | Admitting: Obstetrics and Gynecology

## 2013-09-06 NOTE — Telephone Encounter (Signed)
Start taking PNV, Iron BID

## 2013-09-06 NOTE — Telephone Encounter (Signed)
[facility]/2013 Hbg today 7.8, pt not taking prenatal vitamins nor iron supplements. Please advise.

## 2013-09-06 NOTE — Telephone Encounter (Signed)
Pt informed to take PNV and iron supplements.

## 2013-10-16 ENCOUNTER — Ambulatory Visit (INDEPENDENT_AMBULATORY_CARE_PROVIDER_SITE_OTHER): Payer: Medicaid Other | Admitting: Advanced Practice Midwife

## 2013-10-16 ENCOUNTER — Encounter: Payer: Self-pay | Admitting: Advanced Practice Midwife

## 2013-10-16 VITALS — BP 112/74 | Ht 69.0 in | Wt 223.0 lb

## 2013-10-16 DIAGNOSIS — Z3202 Encounter for pregnancy test, result negative: Secondary | ICD-10-CM

## 2013-10-16 LAB — POCT URINE PREGNANCY: Preg Test, Ur: NEGATIVE

## 2013-10-16 MED ORDER — ETONOGESTREL-ETHINYL ESTRADIOL 0.12-0.015 MG/24HR VA RING: VAGINAL_RING | VAGINAL | Status: DC

## 2013-10-16 NOTE — Progress Notes (Signed)
  Sharon Lutz is a 28 y.o. who presents for a postpartum visit. She is 6 weeks postpartum following a spontaneous vaginal delivery. I have fully reviewed the prenatal and intrapartum course. The delivery was at 39.2 gestational weeks. She had a pph d/t retained placenta.  Anesthesia: epidural. Postpartum course has been uneventful. Baby's course has been uneventful. Baby is feeding by bottle. Bleeding: spotting some. Bowel function is normal. Bladder function is normal. Patient is not sexually active. Contraception method is NuvaRing vaginal inserts. Postpartum depression screening: negative.    Review of Systems   Constitutional: Negative for fever and chills Eyes: Negative for visual disturbances Respiratory: Negative for shortness of breath, dyspnea Cardiovascular: Negative for chest pain or palpitations  Gastrointestinal: Negative for vomiting, diarrhea and constipation Genitourinary: Negative for dysuria and urgency Musculoskeletal: Negative for back pain, joint pain, myalgias  Neurological: Negative for dizziness and headaches   Objective:     Filed Vitals:   10/16/13 1009  BP: 112/74   General:  alert, cooperative and no distress   Breasts:  negative  Lungs: clear to auscultation bilaterally  Heart:  regular rate and rhythm  Abdomen: Soft, nontender   Vulva:  normal  Vagina: normal vagina  Cervix:  closed  Corpus: Well involuted     Rectal Exam: no hemorrhoids        Assessment:    normal postpartum exam.  Plan:    1. Contraception: NuvaRing vaginal inserts 2. Follow up in:   or as needed.

## 2013-10-22 ENCOUNTER — Telehealth: Payer: Self-pay | Admitting: Obstetrics and Gynecology

## 2013-10-22 NOTE — Telephone Encounter (Signed)
Pt requesting note to return to work. Pt delivered 09/01/2013 SVD, saw Rodena Piety, CNM on 10/16/2013 for postpartum visit note to return as needed. Pt requesting note to be faxed to [phone].

## 2014-03-25 ENCOUNTER — Encounter: Payer: Self-pay | Admitting: Advanced Practice Midwife

## 2014-10-22 ENCOUNTER — Other Ambulatory Visit: Payer: Self-pay | Admitting: Advanced Practice Midwife

## 2014-11-04 ENCOUNTER — Encounter (HOSPITAL_COMMUNITY): Payer: Self-pay | Admitting: *Deleted

## 2014-11-04 ENCOUNTER — Emergency Department (INDEPENDENT_AMBULATORY_CARE_PROVIDER_SITE_OTHER)
Admission: EM | Admit: 2014-11-04 | Discharge: 2014-11-04 | Disposition: A | Discharge status: Home / Self Care | Payer: PRIVATE HEALTH INSURANCE | Source: Home / Self Care | Attending: Family Medicine | Admitting: Family Medicine

## 2014-11-04 DIAGNOSIS — J069 Acute upper respiratory infection, unspecified: Secondary | ICD-10-CM

## 2014-11-04 LAB — POCT RAPID STREP A: Streptococcus, Group A Screen (Direct): NEGATIVE

## 2014-11-04 MED ORDER — GUAIFENESIN-CODEINE 100-10 MG/5ML PO SYRP: 10.0000 mL | ORAL_SOLUTION | Freq: Four times a day (QID) | ORAL | Status: DC | PRN

## 2014-11-04 MED ORDER — IPRATROPIUM BROMIDE 0.06 % NA SOLN: 2.0000 | Freq: Four times a day (QID) | NASAL | Status: DC

## 2014-11-04 MED ORDER — AZITHROMYCIN 250 MG PO TABS: ORAL_TABLET | ORAL | Status: DC

## 2014-11-04 NOTE — ED Provider Notes (Signed)
CSN: [id number]     Arrival date & time 11/04/14  1846 History   First MD Initiated Contact with Patient 11/04/14 1943     Chief Complaint  Patient presents with  . URI   (Consider location/radiation/quality/duration/timing/severity/associated sxs/prior Treatment) Patient is a 29 y.o. female presenting with URI. The history is provided by the patient.  URI Presenting symptoms: congestion, cough, rhinorrhea and sore throat   Presenting symptoms: no fever   Severity:  Mild Onset quality:  Gradual Progression:  Unchanged Chronicity:  New Relieved by:  None tried Worsened by:  Nothing tried Ineffective treatments:  None tried   Past Medical History  Diagnosis Date  . Nausea 01/31/2013  . Pregnant 01/31/2013   History reviewed. No pertinent past surgical history. Family History  Problem Relation Age of Onset  . Diabetes Maternal Grandmother   . Diabetes Maternal Aunt   . Diabetes Maternal Uncle    History  Substance Use Topics  . Smoking status: Never Smoker   . Smokeless tobacco: Never Used  . Alcohol Use: Yes   OB History    Gravida Para Term Preterm AB TAB SAB Ectopic Multiple Living   2 2 2       2      Review of Systems  Constitutional: Negative.  Negative for fever.  HENT: Positive for congestion, postnasal drip, rhinorrhea and sore throat.   Respiratory: Positive for cough. Negative for shortness of breath.     Allergies  Review of patient's allergies indicates no known allergies.  Home Medications   Prior to Admission medications   Medication Sig Start Date End Date Taking? Authorizing Provider  azithromycin (ZITHROMAX Z-PAK) 250 MG tablet Take as directed on pack 11/04/14   Linna Hoff, MD  ferrous fumarate (HEMOCYTE - 106 MG FE) 325 (106 FE) MG TABS tablet Take 1 tablet by mouth.    Historical Provider, MD  guaiFENesin-codeine (ROBITUSSIN AC) 100-10 MG/5ML syrup Take 10 mLs by mouth 4 (four) times daily as needed for cough. 11/04/14   Linna Hoff, MD   ibuprofen (ADVIL,MOTRIN) 600 MG tablet Take 1 tablet (600 mg total) by mouth every 6 (six) hours. 09/03/13   Montez Morita, CNM  ipratropium (ATROVENT) 0.06 % nasal spray Place 2 sprays into both nostrils 4 (four) times daily. 11/04/14   Linna Hoff, MD  NUVARING 0.12-0.015 MG/24HR vaginal ring INSERT VAGINALLY AND LEAVE IN PLACE FOR 3 CONSECUTIVE WEEKS, THEN REMOVE FOR 1 WEEK. 10/23/14   Jacklyn Shell, CNM  prenatal vitamin w/FE, FA (PRENATAL 1 + 1) 27-1 MG TABS tablet Take 1 tablet by mouth daily at 12 noon. 03/27/13   Adline Potter, NP   BP 130/79 mmHg  Pulse 103  Temp(Src) 98.7 F (37.1 C) (Oral)  Resp 16  SpO2 98%  LMP 10/17/2014 Physical Exam  Constitutional: She is oriented to person, place, and time. She appears well-developed and well-nourished.  HENT:  Head: Normocephalic.  Right Ear: External ear normal.  Left Ear: External ear normal.  Mouth/Throat: Oropharynx is clear and moist.  Eyes: Pupils are equal, round, and reactive to light.  Neck: Normal range of motion. Neck supple.  Cardiovascular: Normal rate, regular rhythm, normal heart sounds and intact distal pulses.   Pulmonary/Chest: Effort normal and breath sounds normal.  Lymphadenopathy:    She has no cervical adenopathy.  Neurological: She is alert and oriented to person, place, and time.  Skin: Skin is warm and dry.  Nursing note and vitals reviewed.   ED  Course  Procedures (including critical care time) Labs Review Labs Reviewed  POCT RAPID STREP A    Imaging Review No results found.   MDM   1. URI (upper respiratory infection)        Linna Hoff, MD 11/04/14 2005

## 2014-11-04 NOTE — ED Notes (Signed)
Pt  Reports  Symptoms  Of  Cough  /  Congested   With  sorethroat       And  Sneezing    With  Symptoms  X  4  Days            Sneezing  As   Well

## 2014-11-04 NOTE — Discharge Instructions (Signed)
Drink plenty of fluids as discussed, use medicine as prescribed, and mucinex or delsym for cough. Return or see your doctor if further problems °

## 2014-11-06 LAB — CULTURE, GROUP A STREP: Strep A Culture: NEGATIVE

## 2014-11-07 NOTE — ED Notes (Signed)
Final report negative for strep.  

## 2014-12-19 ENCOUNTER — Emergency Department (HOSPITAL_COMMUNITY)
Admission: EM | Admit: 2014-12-19 | Discharge: 2014-12-19 | Disposition: A | Discharge status: Home / Self Care | Payer: No Typology Code available for payment source | Attending: Emergency Medicine | Admitting: Emergency Medicine

## 2014-12-19 ENCOUNTER — Encounter (HOSPITAL_COMMUNITY): Payer: Self-pay | Admitting: Emergency Medicine

## 2014-12-19 DIAGNOSIS — Y9389 Activity, other specified: Secondary | ICD-10-CM | POA: Diagnosis not present

## 2014-12-19 DIAGNOSIS — Y998 Other external cause status: Secondary | ICD-10-CM | POA: Diagnosis not present

## 2014-12-19 DIAGNOSIS — S00412A Abrasion of left ear, initial encounter: Secondary | ICD-10-CM | POA: Diagnosis not present

## 2014-12-19 DIAGNOSIS — X58XXXA Exposure to other specified factors, initial encounter: Secondary | ICD-10-CM | POA: Diagnosis not present

## 2014-12-19 DIAGNOSIS — Z79899 Other long term (current) drug therapy: Secondary | ICD-10-CM | POA: Diagnosis not present

## 2014-12-19 DIAGNOSIS — T162XXA Foreign body in left ear, initial encounter: Secondary | ICD-10-CM | POA: Diagnosis not present

## 2014-12-19 DIAGNOSIS — Y9289 Other specified places as the place of occurrence of the external cause: Secondary | ICD-10-CM | POA: Insufficient documentation

## 2014-12-19 MED ORDER — LIDOCAINE VISCOUS 2 % MT SOLN
5.0000 mL | Freq: Once | OROMUCOSAL | Status: AC
  Administered 2014-12-19: 5 mL via OROMUCOSAL
  Filled 2014-12-19: qty 15

## 2014-12-19 NOTE — ED Provider Notes (Signed)
CSN: [id number]     Arrival date & time 12/19/14  0240 History   First MD Initiated Contact with Patient 12/19/14 0300     Chief Complaint  Patient presents with  . Foreign Body in Ear     Patient is a 29 y.o. female presenting with foreign body in ear. The history is provided by the patient.  Foreign Body in Ear This is a new problem. The current episode started 1 to 2 hours ago. The problem occurs constantly. The problem has not changed since onset.Nothing aggravates the symptoms. Nothing relieves the symptoms.    Past Medical History  Diagnosis Date  . Nausea 01/31/2013  . Pregnant 01/31/2013   History reviewed. No pertinent past surgical history. Family History  Problem Relation Age of Onset  . Diabetes Maternal Grandmother   . Diabetes Maternal Aunt   . Diabetes Maternal Uncle    History  Substance Use Topics  . Smoking status: Never Smoker   . Smokeless tobacco: Never Used  . Alcohol Use: Yes   OB History    Gravida Para Term Preterm AB TAB SAB Ectopic Multiple Living   Review of Systems  Constitutional: Negative for fever.  HENT: Negative for ear discharge.       Allergies  Review of patient's allergies indicates no known allergies.  Home Medications   Prior to Admission medications   Medication Sig Start Date End Date Taking? Authorizing Provider  NUVARING 0.12-0.015 MG/24HR vaginal ring INSERT VAGINALLY AND LEAVE IN PLACE FOR 3 CONSECUTIVE WEEKS, THEN REMOVE FOR 1 WEEK. 10/23/14  Yes Jacklyn Shell, CNM  azithromycin (ZITHROMAX Z-PAK) 250 MG tablet Take as directed on pack 11/04/14   Linna Hoff, MD  ferrous fumarate (HEMOCYTE - 106 MG FE) 325 (106 FE) MG TABS tablet Take 1 tablet by mouth.    Historical Provider, MD  guaiFENesin-codeine (ROBITUSSIN AC) 100-10 MG/5ML syrup Take 10 mLs by mouth 4 (four) times daily as needed for cough. 11/04/14   Linna Hoff, MD  ibuprofen (ADVIL,MOTRIN) 600 MG tablet Take 1 tablet (600 mg total)  by mouth every 6 (six) hours. 09/03/13   Montez Morita, CNM  ipratropium (ATROVENT) 0.06 % nasal spray Place 2 sprays into both nostrils 4 (four) times daily. 11/04/14   Linna Hoff, MD  prenatal vitamin w/FE, FA (PRENATAL 1 + 1) 27-1 MG TABS tablet Take 1 tablet by mouth daily at 12 noon. 03/27/13   Adline Potter, NP   BP 110/80 mmHg  Pulse 79  Temp(Src) 98.1 F (36.7 C) (Oral)  Resp 16  Ht  (1.753 m)  Wt 190 lb (86.183 kg)  BMI 28.05 kg/m2  SpO2 100%  LMP 11/19/2014 Physical Exam CONSTITUTIONAL: Well developed/well nourished HEAD: Normocephalic/atraumatic EYES: EOMI/PERRL ENMT: Mucous membranes moist, right TM clear/intact, left ear canal reveals insect, no drainage/bleeding noted NECK: supple no meningeal signs NEURO: Pt is awake/alert/appropriate, moves all extremitiesx4.  No facial droop.   EXTREMITIES:  full ROM SKIN: warm, color normal PSYCH: no abnormalities of mood noted, alert and oriented to situation  ED Course  FOREIGN BODY REMOVAL Date/Time: 12/19/2014 4:38 AM Performed by: Zadie Rhine Authorized by: Zadie Rhine Consent: Verbal consent obtained. Body area: ear Location details: left ear Removal mechanism: alligator forceps Complexity: simple 0 objects recovered. Post-procedure assessment: foreign body not removed Patient tolerance: Patient tolerated the procedure well with no immediate complications Comments: Small amt of lidocaine  placed in canal Unable to extract FB with forceps Small abrasion noted to ear canal but no TM perforation    Initially unable to obtain insect with forceps Nurse irrigated left ear and insect was extracted Repeat exam reveals TM is intact Pt tolerated well Stable for d/c home MDM   Final diagnoses:  Foreign body in ear, left, initial encounter    Nursing notes including past medical history and social history reviewed and considered in documentation     Zadie Rhine, MD 12/19/14 [phone]

## 2014-12-19 NOTE — ED Notes (Signed)
Pt left ED, ambulatory with no signs of distress. Pt verbalized discharge instructions. 

## 2014-12-19 NOTE — ED Notes (Signed)
Pt c/o fluttering in the left ear.

## 2014-12-19 NOTE — Discharge Instructions (Signed)
Ear Foreign Body °An ear foreign body is an object that is stuck in the ear. It is common for young children to put objects into the ear canal. These may include pebbles, beads, beans, and any other small objects which will fit. In adults, objects such as cotton swabs may become lodged in the ear canal. In all ages, the most common foreign bodies are insects that enter the ear canal.  °SYMPTOMS  °Foreign bodies may cause pain, buzzing or roaring sounds, hearing loss, and ear drainage.  °HOME CARE INSTRUCTIONS  °· Keep all follow-up appointments with your caregiver as told. °· Keep small objects out of reach of young children. Tell them not to put anything in their ears. °SEEK IMMEDIATE MEDICAL CARE IF:  °· You have bleeding from the ear. °· You have increased pain or swelling of the ear. °· You have reduced hearing. °· You have discharge coming from the ear. °· You have a fever. °· You have a headache. °MAKE SURE YOU:  °· Understand these instructions. °· Will watch your condition. °· Will get help right away if you are not doing well or get worse. °Document Released: 05/07/2000 Document Revised: 08/02/2011 Document Reviewed: 12/27/2007 °[employer]® Patient Information ©2015 [employer]. This information is not intended to replace advice given to you by your health care provider. Make sure you discuss any questions you have with your health care provider. ° °

## 2015-08-17 ENCOUNTER — Emergency Department (HOSPITAL_COMMUNITY)
Admission: EM | Admit: 2015-08-17 | Discharge: 2015-08-17 | Disposition: A | Discharge status: Home / Self Care | Payer: No Typology Code available for payment source | Attending: Emergency Medicine | Admitting: Emergency Medicine

## 2015-08-17 ENCOUNTER — Emergency Department (HOSPITAL_COMMUNITY): Admission: EM | Admit: 2015-08-17 | Payer: Self-pay | Source: Home / Self Care

## 2015-08-17 ENCOUNTER — Encounter (HOSPITAL_COMMUNITY): Payer: Self-pay | Admitting: Emergency Medicine

## 2015-08-17 DIAGNOSIS — Y939 Activity, unspecified: Secondary | ICD-10-CM | POA: Insufficient documentation

## 2015-08-17 DIAGNOSIS — M25552 Pain in left hip: Secondary | ICD-10-CM | POA: Insufficient documentation

## 2015-08-17 DIAGNOSIS — Y999 Unspecified external cause status: Secondary | ICD-10-CM | POA: Diagnosis not present

## 2015-08-17 DIAGNOSIS — M79602 Pain in left arm: Secondary | ICD-10-CM | POA: Insufficient documentation

## 2015-08-17 DIAGNOSIS — Y9289 Other specified places as the place of occurrence of the external cause: Secondary | ICD-10-CM | POA: Diagnosis not present

## 2015-08-17 DIAGNOSIS — Z041 Encounter for examination and observation following transport accident: Secondary | ICD-10-CM | POA: Diagnosis present
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
## 2015-08-17 NOTE — Discharge Instructions (Signed)
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
## 2017-12-27 ENCOUNTER — Other Ambulatory Visit (HOSPITAL_COMMUNITY)
Admission: RE | Admit: 2017-12-27 | Discharge: 2017-12-27 | Disposition: A | Discharge status: Home / Self Care | Payer: Medicaid Other | Source: Ambulatory Visit | Attending: Obstetrics & Gynecology | Admitting: Obstetrics & Gynecology

## 2017-12-27 ENCOUNTER — Ambulatory Visit (INDEPENDENT_AMBULATORY_CARE_PROVIDER_SITE_OTHER): Payer: Medicaid Other | Admitting: Obstetrics & Gynecology

## 2017-12-27 ENCOUNTER — Encounter: Payer: Self-pay | Admitting: Obstetrics & Gynecology

## 2017-12-27 VITALS — BP 114/70 | HR 80 | Ht 69.5 in | Wt 225.0 lb

## 2017-12-27 DIAGNOSIS — Z309 Encounter for contraceptive management, unspecified: Secondary | ICD-10-CM

## 2017-12-27 DIAGNOSIS — Z01419 Encounter for gynecological examination (general) (routine) without abnormal findings: Secondary | ICD-10-CM | POA: Diagnosis not present

## 2017-12-27 MED ORDER — ETONOGESTREL-ETHINYL ESTRADIOL 0.12-0.015 MG/24HR VA RING: VAGINAL_RING | VAGINAL | 12 refills | Status: DC

## 2017-12-27 NOTE — Progress Notes (Signed)
Subjective:     Sharon Lutz is a 32 y.o. female here for a routine exam.  Patient's last menstrual period was 12/18/2017 (exact date). [id number] Birth Control Method:  Nuva Ring Menstrual Calendar(currently): regular  Current complaints: weight gain.   Current acute medical issues:  none   Recent Gynecologic History Patient's last menstrual period was 12/18/2017 (exact date). Last Pap: 2018,  normal Last mammogram: ,    Past Medical History:  Diagnosis Date  . Nausea 01/31/2013  . Pregnant 01/31/2013    History reviewed. No pertinent surgical history.  OB History    Gravida  2   Para  2   Term  2   Preterm      AB      Living  2     SAB      TAB      Ectopic      Multiple      Live Births  2           Social History   Socioeconomic History  . Marital status: Married    Spouse name: Not on file  . Number of children: Not on file  . Years of education: Not on file  . Highest education level: Not on file  Occupational History  . Not on file  Social Needs  . Financial resource strain: Not on file  . Food insecurity:    Worry: Not on file    Inability: Not on file  . Transportation needs:    Medical: Not on file    Non-medical: Not on file  Tobacco Use  . Smoking status: Never Smoker  . Smokeless tobacco: Never Used  Substance and Sexual Activity  . Alcohol use: Yes  . Drug use: No  . Sexual activity: Yes    Birth control/protection: Inserts  Lifestyle  . Physical activity:    Days per week: Not on file    Minutes per session: Not on file  . Stress: Not on file  Relationships  . Social connections:    Talks on phone: Not on file    Gets together: Not on file    Attends religious service: Not on file    Active member of club or organization: Not on file    Attends meetings of clubs or organizations: Not on file    Relationship status: Not on file  Other Topics Concern  . Not on file  Social History Narrative  . Not on file     Family History  Problem Relation Age of Onset  . Diabetes Maternal Grandmother   . Diabetes Maternal Aunt   . Diabetes Maternal Uncle      Current Outpatient Medications:  .  etonogestrel-ethinyl estradiol (NUVARING) 0.12-0.015 MG/24HR vaginal ring, INSERT VAGINALLY AND LEAVE IN PLACE FOR 3 CONSECUTIVE WEEKS, THEN REMOVE FOR 1 WEEK., Disp: 1 each, Rfl: 12 .  prenatal vitamin w/FE, FA (PRENATAL 1 + 1) 27-1 MG TABS tablet, Take 1 tablet by mouth daily at 12 noon., Disp: 30 each, Rfl: 11 .  ferrous fumarate (HEMOCYTE - 106 MG FE) 325 (106 FE) MG TABS tablet, Take 1 tablet by mouth., Disp: , Rfl:   Review of Systems  Review of Systems  Constitutional: Negative for fever, chills, weight loss, malaise/fatigue and diaphoresis.  HENT: Negative for hearing loss, ear pain, nosebleeds, congestion, sore throat, neck pain, tinnitus and ear discharge.   Eyes: Negative for blurred vision, double vision, photophobia, pain, discharge and redness.  Respiratory: Negative for  cough, hemoptysis, sputum production, shortness of breath, wheezing and stridor.   Cardiovascular: Negative for chest pain, palpitations, orthopnea, claudication, leg swelling and PND.  Gastrointestinal: negative for abdominal pain. Negative for heartburn, nausea, vomiting, diarrhea, constipation, blood in stool and melena.  Genitourinary: Negative for dysuria, urgency, frequency, hematuria and flank pain.  Musculoskeletal: Negative for myalgias, back pain, joint pain and falls.  Skin: Negative for itching and rash.  Neurological: Negative for dizziness, tingling, tremors, sensory change, speech change, focal weakness, seizures, loss of consciousness, weakness and headaches.  Endo/Heme/Allergies: Negative for environmental allergies and polydipsia. Does not bruise/bleed easily.  Psychiatric/Behavioral: Negative for depression, suicidal ideas, hallucinations, memory loss and substance abuse. The patient is not nervous/anxious and  does not have insomnia.        Objective:  Blood pressure 114/70, pulse 80, height 5' 9.5" (1.765 m), weight 225 lb (102.1 kg), last menstrual period 12/18/2017.   Physical Exam  Vitals reviewed. Constitutional: She is oriented to person, place, and time. She appears well-developed and well-nourished.  HENT:  Head: Normocephalic and atraumatic.        Right Ear: External ear normal.  Left Ear: External ear normal.  Nose: Nose normal.  Mouth/Throat: Oropharynx is clear and moist.  Eyes: Conjunctivae and EOM are normal. Pupils are equal, round, and reactive to light. Right eye exhibits no discharge. Left eye exhibits no discharge. No scleral icterus.  Neck: Normal range of motion. Neck supple. No tracheal deviation present. No thyromegaly present.  Cardiovascular: Normal rate, regular rhythm, normal heart sounds and intact distal pulses.  Exam reveals no gallop and no friction rub.   No murmur heard. Respiratory: Effort normal and breath sounds normal. No respiratory distress. She has no wheezes. She has no rales. She exhibits no tenderness.  GI: Soft. Bowel sounds are normal. She exhibits no distension and no mass. There is no tenderness. There is no rebound and no guarding.  Genitourinary:  Breasts no masses skin changes or nipple changes bilaterally      Vulva is normal without lesions Vagina is pink moist without discharge Cervix normal in appearance and pap is done Uterus is normal size shape and contour Adnexa is negative with normal sized ovaries   Musculoskeletal: Normal range of motion. She exhibits no edema and no tenderness.  Neurological: She is alert and oriented to person, place, and time. She has normal reflexes. She displays normal reflexes. No cranial nerve deficit. She exhibits normal muscle tone. Coordination normal.  Skin: Skin is warm and dry. No rash noted. No erythema. No pallor.  Psychiatric: She has a normal mood and affect. Her behavior is normal. Judgment and  thought content normal.       Medications Ordered at today's visit: Meds ordered this encounter  Medications  . etonogestrel-ethinyl estradiol (NUVARING) 0.12-0.015 MG/24HR vaginal ring    Sig: INSERT VAGINALLY AND LEAVE IN PLACE FOR 3 CONSECUTIVE WEEKS, THEN REMOVE FOR 1 WEEK.    Dispense:  1 each    Refill:  12    Other orders placed at today's visit: No orders of the defined types were placed in this encounter.     Assessment:    Healthy female exam.    Plan:    Contraception: NuvaRing vaginal inserts. Follow up in: 1 year. weight watchers online portal discussed     Return in about 1 year (around 12/28/2018) for yearly, with Dr Despina HiddenEure.

## 2017-12-28 LAB — CYTOLOGY - PAP
Chlamydia: NEGATIVE
Diagnosis: NEGATIVE
HPV (WINDOPATH): NOT DETECTED
Neisseria Gonorrhea: NEGATIVE

## 2019-01-18 ENCOUNTER — Telehealth: Payer: Self-pay | Admitting: Advanced Practice Midwife

## 2019-01-18 ENCOUNTER — Telehealth: Payer: Self-pay | Admitting: Obstetrics & Gynecology

## 2019-01-18 MED ORDER — ETONOGESTREL-ETHINYL ESTRADIOL 0.12-0.015 MG/24HR VA RING: VAGINAL_RING | VAGINAL | 12 refills | Status: DC

## 2019-01-18 NOTE — Telephone Encounter (Signed)
Pt is requesting a refill on NuvaRing. Please advise. Thanks!! [place]

## 2019-01-18 NOTE — Telephone Encounter (Signed)
Pt has appt at [facility] for physical and birth control refill but she is out and they will not refill it since they have not seen her/ can we call in 1 refill to last til she can see them. [employer]

## 2019-01-18 NOTE — Telephone Encounter (Signed)
done

## 2019-01-23 DIAGNOSIS — Z3044 Encounter for surveillance of vaginal ring hormonal contraceptive device: Secondary | ICD-10-CM | POA: Diagnosis not present

## 2019-01-23 DIAGNOSIS — Z01419 Encounter for gynecological examination (general) (routine) without abnormal findings: Secondary | ICD-10-CM | POA: Diagnosis not present

## 2019-01-23 DIAGNOSIS — Z3009 Encounter for other general counseling and advice on contraception: Secondary | ICD-10-CM | POA: Diagnosis not present

## 2019-01-23 DIAGNOSIS — Z113 Encounter for screening for infections with a predominantly sexual mode of transmission: Secondary | ICD-10-CM | POA: Diagnosis not present

## 2019-03-22 DIAGNOSIS — Z3044 Encounter for surveillance of vaginal ring hormonal contraceptive device: Secondary | ICD-10-CM | POA: Diagnosis not present

## 2020-01-13 DIAGNOSIS — Z23 Encounter for immunization: Secondary | ICD-10-CM | POA: Diagnosis not present

## 2020-01-29 DIAGNOSIS — Z0389 Encounter for observation for other suspected diseases and conditions ruled out: Secondary | ICD-10-CM | POA: Diagnosis not present

## 2020-01-29 DIAGNOSIS — Z113 Encounter for screening for infections with a predominantly sexual mode of transmission: Secondary | ICD-10-CM | POA: Diagnosis not present

## 2020-01-29 DIAGNOSIS — Z1388 Encounter for screening for disorder due to exposure to contaminants: Secondary | ICD-10-CM | POA: Diagnosis not present

## 2020-01-29 DIAGNOSIS — Z3044 Encounter for surveillance of vaginal ring hormonal contraceptive device: Secondary | ICD-10-CM | POA: Diagnosis not present

## 2020-01-29 DIAGNOSIS — Z3009 Encounter for other general counseling and advice on contraception: Secondary | ICD-10-CM | POA: Diagnosis not present

## 2020-01-29 DIAGNOSIS — Z01419 Encounter for gynecological examination (general) (routine) without abnormal findings: Secondary | ICD-10-CM | POA: Diagnosis not present

## 2020-05-21 DIAGNOSIS — Z3201 Encounter for pregnancy test, result positive: Secondary | ICD-10-CM | POA: Diagnosis not present

## 2020-06-13 ENCOUNTER — Other Ambulatory Visit: Payer: Self-pay | Admitting: Obstetrics & Gynecology

## 2020-06-13 DIAGNOSIS — O3680X0 Pregnancy with inconclusive fetal viability, not applicable or unspecified: Secondary | ICD-10-CM

## 2020-06-16 ENCOUNTER — Other Ambulatory Visit: Payer: Self-pay

## 2020-06-16 ENCOUNTER — Ambulatory Visit (INDEPENDENT_AMBULATORY_CARE_PROVIDER_SITE_OTHER): Payer: Medicaid Other

## 2020-06-16 ENCOUNTER — Other Ambulatory Visit: Payer: Medicaid Other

## 2020-06-16 DIAGNOSIS — O3680X0 Pregnancy with inconclusive fetal viability, not applicable or unspecified: Secondary | ICD-10-CM | POA: Diagnosis not present

## 2020-06-16 DIAGNOSIS — Z3A08 8 weeks gestation of pregnancy: Secondary | ICD-10-CM | POA: Diagnosis not present

## 2020-06-16 NOTE — Progress Notes (Addendum)
[place] 6+3 wks single IUP,no fetal heart tones visualized,crl 6.27 mm,GS 44 mm=9+6 wks,normal left ovary,two simple right ovarian cysts (#1) 2.7 x 2.4 x 2.4 cm,(#2) 2.5 x 2.2 x 2.1 cm,labs and f/u ultrasound in 10 days per Victorino Dike

## 2020-06-18 ENCOUNTER — Telehealth: Payer: Self-pay

## 2020-06-18 LAB — BETA HCG QUANT (REF LAB): hCG Quant: 26216 m[IU]/mL

## 2020-06-18 NOTE — Telephone Encounter (Signed)
I called [name] per her request to have an explanation of her bHcg level and u/s both done 06/16/20. Explained that a single hormone level doesn't give [place] a lot of information re viability. Review of her u/s showed smaller than expected fetal pole (based on LMP) and that a f/u u/s will give [place] more information. Pt plan to change practices to [facility] because she hasn't received the level of care she expected during this situation; she doesn't like that she was unable to speak with a provider after her u/s. Apologies given. Rec that she come in to sign ROI so that [facility] can have a previous u/s to compare to the f/u one. Rec that if she have any pain or bldg to call.  Sharon Lutz CNM 06/18/2020 4:53 PM

## 2020-06-18 NOTE — Telephone Encounter (Signed)
Patient called wanting to speak with a nurse. Wanting results of lab drawn on Monday and wants results of the ultrasound she had also

## 2020-06-23 ENCOUNTER — Inpatient Hospital Stay (HOSPITAL_COMMUNITY)
Admission: AD | Admit: 2020-06-23 | Discharge: 2020-06-24 | Disposition: A | Discharge status: Home / Self Care | Payer: Medicaid Other | Attending: Obstetrics and Gynecology | Admitting: Obstetrics and Gynecology

## 2020-06-23 ENCOUNTER — Other Ambulatory Visit: Payer: Self-pay

## 2020-06-23 ENCOUNTER — Encounter (HOSPITAL_COMMUNITY): Payer: Self-pay | Admitting: Obstetrics and Gynecology

## 2020-06-23 ENCOUNTER — Inpatient Hospital Stay (HOSPITAL_COMMUNITY): Payer: Medicaid Other

## 2020-06-23 DIAGNOSIS — O039 Complete or unspecified spontaneous abortion without complication: Secondary | ICD-10-CM | POA: Insufficient documentation

## 2020-06-23 DIAGNOSIS — O209 Hemorrhage in early pregnancy, unspecified: Secondary | ICD-10-CM | POA: Diagnosis present

## 2020-06-23 DIAGNOSIS — Z3A01 Less than 8 weeks gestation of pregnancy: Secondary | ICD-10-CM | POA: Diagnosis not present

## 2020-06-23 NOTE — MAU Note (Signed)
Vaginal bleeding and has changes pad twice. Does not completely saturate pads, has had a dime-sized clot. Abdominal cramping that is uncomfortable feeling 1/10 pain.

## 2020-06-24 LAB — URINALYSIS, ROUTINE W REFLEX MICROSCOPIC
Bilirubin Urine: NEGATIVE
Glucose, UA: NEGATIVE mg/dL
Ketones, ur: NEGATIVE mg/dL
Nitrite: NEGATIVE
Protein, ur: NEGATIVE mg/dL
Specific Gravity, Urine: 1.013 (ref 1.005–1.030)
pH: 6 (ref 5.0–8.0)

## 2020-06-24 LAB — CBC
HCT: 37.4 % (ref 36.0–46.0)
Hemoglobin: 11.9 g/dL — ABNORMAL LOW (ref 12.0–15.0)
MCH: 28.5 pg (ref 26.0–34.0)
MCHC: 31.8 g/dL (ref 30.0–36.0)
MCV: 89.5 fL (ref 80.0–100.0)
Platelets: 174 10*3/uL (ref 150–400)
RBC: 4.18 MIL/uL (ref 3.87–5.11)
RDW: 13.2 % (ref 11.5–15.5)
WBC: 6.4 10*3/uL (ref 4.0–10.5)
nRBC: 0 % (ref 0.0–0.2)

## 2020-06-24 LAB — HCG, QUANTITATIVE, PREGNANCY: hCG, Beta Chain, Quant, S: 13492 m[IU]/mL — ABNORMAL HIGH (ref <5)

## 2020-06-24 NOTE — Discharge Instructions (Signed)
Miscarriage A miscarriage is the loss of pregnancy before the 20th week. Most miscarriages happen during the first 3 months of pregnancy. Sometimes, a miscarriage can happen before a woman knows that she is pregnant. Having a miscarriage can be an emotional experience. If you have had a miscarriage, talk with your health care provider about any questions you may have about the loss of your baby, the grieving process, and your plans for future pregnancy. What are the causes? Many times, the cause of a miscarriage is not known. What increases the risk? The following factors may make a pregnant woman more likely to have a miscarriage: Certain medical conditions  Conditions that affect the hormone balance in the body, such as thyroid disease or polycystic ovary syndrome.  Diabetes.  Autoimmune disorders.  Infections.  Bleeding disorders.  Obesity. Lifestyle factors  Using products with tobacco or nicotine in them or being exposed to tobacco smoke.  Having alcohol.  Having large amounts of caffeine.  Recreational drug use. Problems with reproductive organs or structures  Cervical insufficiency. This is when the lowest part of the uterus (cervix) opens and thins before pregnancy is at term.  Having a condition called Asherman syndrome. This syndrome causes scarring in the uterus or causes the uterus to be abnormal in structure.  Fibrous growths, called fibroids, in the uterus.  Congenital abnormalities. These problems are present at birth.  Infection of the cervix or uterus. Personal or medical history  Injury (trauma).  Having had a miscarriage before.  Being younger than age 18 or older than age 35.  Exposure to harmful substances in the environment. This may include radiation or heavy metals, such as lead.  Use of certain medicines. What are the signs or symptoms? Symptoms of this condition include:  Vaginal bleeding or spotting, with or without cramps or  pain.  Pain or cramping in the abdomen or lower back.  Fluid or tissue coming out of the vagina. How is this diagnosed? This condition may be diagnosed based on:  A physical exam.  Ultrasound.  Lab tests, such as blood tests, urine tests, or swabs for infection. How is this treated? Treatment for a miscarriage is sometimes not needed if all the pregnancy tissue that was in the uterus comes out on its own, and there are no other problems such as infection or heavy bleeding. In other cases, this condition may be treated with:  Dilation and curettage (D&C). In this procedure, the cervix is stretched open and any remaining pregnancy tissue is removed from the lining of the uterus (endometrium).  Medicines. These may include: ? Antibiotic medicine, to treat infection. ? Medicine to help any remaining pregnancy tissue come out of the body. ? Medicine to reduce (contract) the size of the uterus. These medicines may be given if there is a lot of bleeding. If you have Rh-negative blood, you may be given an injection of a medicine called Rho(D) immune globulin. This medicine helps prevent problems with future pregnancies. Follow these instructions at home: Medicines  Take over-the-counter and prescription medicines only as told by your health care provider.  If you were prescribed antibiotic medicine, take it as told by your health care provider. Do not stop taking the antibiotic even if you start to feel better. Activity  Rest as told by your health care provider. Ask your health care provider what activities are safe for you.  Have someone help with home and family responsibilities during this time. General instructions  Monitor how much tissue   or blood clot material comes out of the vagina.  Do not have sex, douche, or put anything, such as tampons, in your vagina until your health care provider says it is okay.  To help you and your partner with the grieving process, talk with your  health care provider or get counseling.  When you are ready, meet with your health care provider to discuss any important steps you should take for your health. Also, discuss steps you should take to have a healthy pregnancy in the future.  Keep all follow-up visits. This is important.   Where to find more information  The [employer] of Obstetricians and Gynecologists: acog.org  U.S. Department of Health and [employer], systems of Women's Health: EverydayCosmetics.no Contact a health care provider if:  You have a fever or chills.  There is bad-smelling fluid coming from the vagina.  You have more bleeding instead of less.  Tissue or blood clots come out of your vagina. Get help right away if:  You have severe cramps or pain in your back or abdomen.  Heavy bleeding soaks through 2 large sanitary pads an hour for more than 2 hours.  You become light-headed or weak.  You faint.  You feel sad, and your sadness takes over your thoughts.  You think about hurting yourself. If you ever feel like you may hurt yourself or others, or have thoughts about taking your own life, get help right away. Go to your nearest emergency department or:  Call your local emergency services (911 in the [place]  Call a suicide crisis helpline, such as the [place] at 539-232-6231. This is open 24 hours a day in the [place]  Text the Crisis Text Line at 660-522-6163 (in the [place].). Summary  Most miscarriages happen in the first 3 months of pregnancy. Sometimes miscarriage happens before a woman knows that she is pregnant.  Follow instructions from your health care provider about medicines and activity.  To help you and your partner with grieving, talk with your health care provider or get counseling.  Keep all follow-up visits. This information is not intended to replace advice given to you by your health care provider. Make sure you discuss any questions you  have with your health care provider. Document Revised: 11/09/2019 Document Reviewed: 11/09/2019 Elsevier Patient Education  2021 [employer].   Managing Pregnancy Loss Pregnancy loss can happen any time during a pregnancy. Often the cause is not known. It is rarely because of anything you did. Pregnancy loss in early pregnancy (during the first trimester) is called a miscarriage. This type of pregnancy loss is the most common. Pregnancy loss that happens after 20 weeks of pregnancy is called fetal demise if the baby's heart stops beating before birth. Fetal demise is much less common. Some women experience spontaneous labor shortly after fetal demise resulting in a stillborn birth (stillbirth). Any pregnancy loss can be devastating. You will need to recover both physically and emotionally. Most women are able to get pregnant again after a pregnancy loss and deliver a healthy baby. How to manage emotional recovery Pregnancy loss is very hard emotionally. You may feel many different emotions while you grieve. You may feel sad and angry. You may also feel guilty. It is normal to have periods of crying. Emotional recovery can take longer than physical recovery. It is different for everyone. Taking these steps can help you in managing this loss:  Remember that it is unlikely you did anything to cause  to cause the pregnancy loss.  Share your thoughts and feelings with friends, family, and your partner. Remember that your partner is also recovering emotionally.  Make sure you have a good support system. Do not spend too much time alone.  Meet with a pregnancy loss counselor or join a pregnancy loss support group.  Get enough sleep and eat a healthy diet. Return to regular exercise when you have recovered physically.  Do not use drugs or alcohol to manage your emotions.  Consider seeing a mental health professional to help you recover emotionally.  Ask a friend or loved one to help you decide what to  do with any clothing and nursery items you received for your baby. In the case of a stillbirth, many women benefit from taking additional steps in the grieving process. You may want to:  Hold your baby after the birth.  Name your baby.  Request a birth certificate.  Create a keepsake such as handprints or footprints.  Dress your baby and have a picture taken.  Make funeral arrangements.  Ask for a baptism or blessing. Hospitals have staff members who can help you with all these arrangements.   How to recognize emotional stress It is normal to have emotional stress after a pregnancy loss. But emotional stress that lasts a long time or becomes severe requires treatment. Watch out for these signs of severe emotional stress:  Sadness, anger, or guilt that is not going away and is interfering with your normal activities.  Relationship problems that have occurred or gotten worse since the pregnancy loss.  Signs of depression that last longer than 2 weeks. These may include: ? Sadness. ? Anxiety. ? Hopelessness. ? Loss of interest in activities you enjoy. ? Inability to concentrate. ? Trouble sleeping or sleeping too much. ? Loss of appetite or overeating. ? Thoughts of death or of hurting yourself. Follow these instructions at home:  Take over-the-counter and prescription medicines only as told by your health care provider.  Rest at home until your energy level returns. Return to your normal activities as told by your health care provider. Ask your health care provider what activities are safe for you.  When you are ready, meet with your health care provider to discuss steps to take for a future pregnancy.  Keep all follow-up visits as told by your health care provider. This is important. Where to find support  To help you and your partner with the process of grieving, talk with your health care provider or seek counseling.  Consider meeting with others who have experienced  pregnancy loss. Ask your health care provider about support groups and resources. Where to find more information  U.S. Department of Health and Human Services Office on Women's Health: www.womenshealth.gov  American Pregnancy Association: www.americanpregnancy.org Contact a health care provider if:  You continue to experience grief, sadness, or lack of motivation for everyday activities, and those feelings do not improve over time.  You are struggling to recover emotionally, especially if you are using alcohol or substances to help. Get help right away if:  You have thoughts of hurting yourself or others. If you ever feel like you may hurt yourself or others, or have thoughts about taking your own life, get help right away. You can go to your nearest emergency department or call:  Your local emergency services (911 in the [place]  A suicide crisis helpline, such as the National Suicide Prevention Lifeline at 1-800-273-8255. This is open 24 hours a day. Summary    pregnancy loss can be difficult physically and emotionally.  You may experience many different emotions while you grieve. Emotional recovery can last longer than physical recovery.  It is normal to have emotional stress after a pregnancy loss. But emotional stress that lasts a long time or becomes severe requires treatment.  See your health care provider if you are struggling emotionally after a pregnancy loss. This information is not intended to replace advice given to you by your health care provider. Make sure you discuss any questions you have with your health care provider. Document Revised: 08/30/2018 Document Reviewed: 07/21/2017 Elsevier Patient Education  2021 [employer].

## 2020-06-26 ENCOUNTER — Other Ambulatory Visit: Payer: Medicaid Other

## 2020-06-26 ENCOUNTER — Ambulatory Visit: Payer: Medicaid Other | Admitting: Advanced Practice Midwife

## 2020-07-02 ENCOUNTER — Other Ambulatory Visit: Payer: Self-pay

## 2020-07-02 ENCOUNTER — Ambulatory Visit (INDEPENDENT_AMBULATORY_CARE_PROVIDER_SITE_OTHER): Payer: Medicaid Other | Admitting: Obstetrics and Gynecology

## 2020-07-02 ENCOUNTER — Encounter: Payer: Self-pay | Admitting: Obstetrics and Gynecology

## 2020-07-02 VITALS — BP 108/66 | HR 95 | Ht 70.0 in | Wt 232.2 lb

## 2020-07-02 DIAGNOSIS — O039 Complete or unspecified spontaneous abortion without complication: Secondary | ICD-10-CM | POA: Insufficient documentation

## 2020-07-02 DIAGNOSIS — D649 Anemia, unspecified: Secondary | ICD-10-CM | POA: Diagnosis not present

## 2020-07-02 LAB — POCT HEMOGLOBIN: Hemoglobin: 7.7 g/dL — AB (ref 11–14.6)

## 2020-07-02 NOTE — Patient Instructions (Signed)

## 2020-07-02 NOTE — Progress Notes (Signed)
Ms Zervas presents for f/u after SAB. Bleeding was decreased to a small amount on a pad qd. She feels tired however.  Blood type O positive Hgb 7.7 in office today  PE AF VSS Lungs clear Heart RRR Abd soft + BS  A/P SAB        Anemia Will check BHCG level and follow to < 5. Check Thyroid panel. Continue with iron, change to qod. Iron infusion ordered, risks and benefits reviewed. F/U per test results.

## 2020-07-03 ENCOUNTER — Telehealth: Payer: Self-pay | Admitting: General Practice

## 2020-07-03 NOTE — Telephone Encounter (Signed)
Pt had moderna vaccine 1st dose on 12/15/2019, 2nd dose on 01/13/2020 and pt is sch for moderna booster on 07/10/2020 at [facility]

## 2020-07-10 ENCOUNTER — Ambulatory Visit: Payer: Medicaid Other

## 2020-07-15 ENCOUNTER — Telehealth: Payer: Self-pay

## 2020-07-15 NOTE — Telephone Encounter (Signed)
Pt is needing orders placed from her last visit with Dr. Alysia Penna for iron injection, pt stated she hasn't been contacted by [facility] for the injection but there is no orders in for this and pt also stated that she isn't on [facility] and this wasn't addressed at her visit. She would like a neuva ring. Also stated that she still is bleeding.

## 2020-07-16 ENCOUNTER — Other Ambulatory Visit: Payer: Self-pay | Admitting: Obstetrics and Gynecology

## 2020-07-18 ENCOUNTER — Encounter (HOSPITAL_COMMUNITY): Payer: Medicaid Other

## 2020-07-22 ENCOUNTER — Encounter (HOSPITAL_COMMUNITY): Payer: Self-pay

## 2020-07-22 ENCOUNTER — Other Ambulatory Visit: Payer: Self-pay

## 2020-07-22 ENCOUNTER — Encounter (HOSPITAL_COMMUNITY)
Admission: RE | Admit: 2020-07-22 | Discharge: 2020-07-22 | Disposition: A | Discharge status: Home / Self Care | Payer: Medicaid Other | Source: Ambulatory Visit | Attending: Obstetrics and Gynecology | Admitting: Obstetrics and Gynecology

## 2020-07-22 DIAGNOSIS — D500 Iron deficiency anemia secondary to blood loss (chronic): Secondary | ICD-10-CM | POA: Insufficient documentation

## 2020-07-22 HISTORY — DX: Anemia, unspecified: D64.9

## 2020-07-22 MED ORDER — SENNOSIDES-DOCUSATE SODIUM 8.6-50 MG PO TABS: 2.0000 | ORAL_TABLET | Freq: Every day | ORAL | Status: DC

## 2020-07-22 MED ORDER — SODIUM CHLORIDE 0.9 % IV SOLN: Freq: Once | INTRAVENOUS | Status: AC

## 2020-07-22 MED ORDER — FERUMOXYTOL INJECTION 510 MG/17 ML
510.0000 mg | Freq: Once | INTRAVENOUS | Status: AC
  Administered 2020-07-22: 510 mg via INTRAVENOUS
  Filled 2020-07-22: qty 510

## 2020-07-28 DIAGNOSIS — Z30011 Encounter for initial prescription of contraceptive pills: Secondary | ICD-10-CM | POA: Diagnosis not present

## 2020-07-28 DIAGNOSIS — Z3009 Encounter for other general counseling and advice on contraception: Secondary | ICD-10-CM | POA: Diagnosis not present

## 2020-10-02 DIAGNOSIS — Z20828 Contact with and (suspected) exposure to other viral communicable diseases: Secondary | ICD-10-CM | POA: Diagnosis not present

## 2022-05-24 NOTE — L&D Delivery Note (Signed)
OB/GYN Faculty Practice Delivery Note  Sharon Lutz is a 37 y.o. [id number] s/p SVD at [redacted]w[redacted]d. She was admitted for SROM.   ROM: 3h 58m with clear fluid GBS Status: Negative/-- (10/02 0259) Maximum Maternal Temperature: Temp (24hrs), Avg:98 F (36.7 C), Min:97.5 F (36.4 C), Max:98.4 F (36.9 C)   Labor Progress: Initial SVE: 7/50/-2. N/A required. She then progressed to complete.   Delivery Date/Time: 11/3 0143 Delivery: Noticed NST with late decelerations to 90s and at that same time was called to room.  Patient found to be complete and started pushing. Head delivered DOA after much effort. Shoulder dystocia encountered, staff / attending notified, baby restituted to maternal R, suprapubic pressure and McRoberts applied, was unable to turn baby or deliver posterior arm but was able to hook the right axilia to deliver anterior shoulder and arm. Dystocia lasted <1 minute duration. No nuchal cord present, but did have knot within the umbilical cord.  Infant with spontaneous cry, placed on mother's abdomen, dried and stimulated. Cord clamped x 2 after 2-minute delay, and cut by FOB. Cord blood drawn. Placenta delivered spontaneously with gentle cord traction and fundal massage. Fundus firm with massage and Pitocin. Labia, perineum, and vagina inspected with periurethral tear noted with good hemostasis to pressure alone, no suture required. Infant holding RUE in position flexed at the elbow and across chest.  Did not mind to have arm manipulated and no obvious crepitus along ipsilateral R clavicle.  Unsure if clavicle fracture or potential brachial plexus etiology.  Informed parents of the above and they were understanding and agreeable to pediatric team evaluating further. Otherwise, mom and baby doing well.   Baby Weight: pending  Placenta: 3 vessel, intact. Sent to L&D Complications: <1 min shoulder dystocia  Lacerations: Periurethral not requiring sutures EBL: 121 mL Anesthesia:  epidural  Infant:  APGAR (1 MIN):  8 APGAR (5 MINS):  9  Sharon Bodo, MD Family Medicine - Obstetrics Fellow   03/27/2023, 2:11 AM

## 2022-06-09 ENCOUNTER — Encounter (HOSPITAL_COMMUNITY): Payer: Self-pay | Admitting: Obstetrics and Gynecology

## 2022-06-09 ENCOUNTER — Inpatient Hospital Stay (HOSPITAL_COMMUNITY)
Admission: AD | Admit: 2022-06-09 | Discharge: 2022-06-09 | Disposition: A | Discharge status: Home / Self Care | Payer: Medicaid Other | Attending: Obstetrics and Gynecology | Admitting: Obstetrics and Gynecology

## 2022-06-09 ENCOUNTER — Other Ambulatory Visit: Payer: Self-pay

## 2022-06-09 DIAGNOSIS — O039 Complete or unspecified spontaneous abortion without complication: Secondary | ICD-10-CM

## 2022-06-09 DIAGNOSIS — Z3202 Encounter for pregnancy test, result negative: Secondary | ICD-10-CM | POA: Diagnosis not present

## 2022-06-09 LAB — HCG, QUANTITATIVE, PREGNANCY: hCG, Beta Chain, Quant, S: 1 m[IU]/mL (ref <5)

## 2022-06-09 LAB — POCT PREGNANCY, URINE: Preg Test, Ur: NEGATIVE

## 2022-06-09 NOTE — MAU Provider Note (Signed)
History Sharon Lutz is a 37 y.o. [id number] at Unknown who presents for positive pregnancy test & history of bleeding.  Reports several positive pregnancy tests in December. Had several days of heavy bleeding the end of December. Recently had a negative pregnancy test (vs faint line) over a week ago. Bleeding has resolved. Does have some intermittent lower abdominal cramping.   Physical exam BP 108/65 (BP Location: Right Arm)   Pulse 90   Temp 98.2 F (36.8 C) (Oral)   Resp 16   Ht 5\' 10"  (1.778 m)   Wt 109.8 kg   LMP 03/28/2022   SpO2 100% Comment: room air  BMI 34.74 kg/m   Physical Examination: General appearance - alert, well appearing, and in no distress Mental status - normal mood, behavior, speech, dress, motor activity, and thought processes Eyes - sclera anicteric Chest - normal respiratory effort  MDM UPT negative HCG negative RH positive  Assessment/Plan 1. Negative pregnancy test   2. Miscarriage    -Likely miscarriage based on patient report of events & positive testing at home. Negative HCG today. Patient stable.  -Patient plans on following up with an OB later this month   Jorje Guild, NP  06/09/2022

## 2022-06-09 NOTE — MAU Note (Signed)
Pt okay to leave once labs have been drawn per NP, pt to remain on MAU census to follow results.

## 2022-06-09 NOTE — MAU Note (Signed)
Sharon Lutz is a 37 y.o. here in MAU reporting: had + UPT in December when she missed her pregnant. Had heavy bleeding at the end of December into January. States she then had positive faint pregnancy tests but has not tested since Jan 5 or 6. No bleeding currently but is having some cramping.   LMP: 03/28/2022  Onset of complaint: ongoing  Pain score: 6/10  Vitals:   06/09/22 1051  BP: 108/65  Pulse: 90  Resp: 16  Temp: 98.2 F (36.8 C)  SpO2: 100%     FHT:NA  Lab orders placed from triage: upt

## 2022-07-12 ENCOUNTER — Encounter: Payer: Self-pay | Admitting: Adult Health

## 2022-07-12 ENCOUNTER — Other Ambulatory Visit (HOSPITAL_COMMUNITY)
Admission: RE | Admit: 2022-07-12 | Discharge: 2022-07-12 | Disposition: A | Discharge status: Home / Self Care | Payer: 59 | Source: Ambulatory Visit | Attending: Adult Health | Admitting: Adult Health

## 2022-07-12 ENCOUNTER — Ambulatory Visit (INDEPENDENT_AMBULATORY_CARE_PROVIDER_SITE_OTHER): Payer: 59 | Admitting: Adult Health

## 2022-07-12 VITALS — BP 112/76 | HR 76 | Ht 69.0 in | Wt 244.0 lb

## 2022-07-12 DIAGNOSIS — Z01419 Encounter for gynecological examination (general) (routine) without abnormal findings: Secondary | ICD-10-CM | POA: Insufficient documentation

## 2022-07-12 DIAGNOSIS — Z3201 Encounter for pregnancy test, result positive: Secondary | ICD-10-CM | POA: Diagnosis not present

## 2022-07-12 DIAGNOSIS — Z3480 Encounter for supervision of other normal pregnancy, unspecified trimester: Secondary | ICD-10-CM | POA: Diagnosis not present

## 2022-07-12 DIAGNOSIS — O09291 Supervision of pregnancy with other poor reproductive or obstetric history, first trimester: Secondary | ICD-10-CM | POA: Insufficient documentation

## 2022-07-12 LAB — POCT URINE PREGNANCY: Preg Test, Ur: POSITIVE — AB

## 2022-07-12 NOTE — Progress Notes (Signed)
Patient ID: Sharon Lutz, female   DOB: [birth year], 37 y.o.   MRN: [id number] History of Present Illness: Sharon Lutz is a 37 year old black female,married, [id number], in for a well woman gyn exam and pap, and wanted to discuss birth control, had miscarriage in December. She was seen in MAU 06/09/22, 37 y.o. and QHCG was 1.  She is working out and wants to lose weight, she is travel CNA now. UPT is + in office today.     Current Medications, Allergies, Past Medical History, Past Surgical History, Family History and Social History were reviewed in [employer] record.     Review of Systems: Patient denies any headaches, hearing loss, fatigue, blurred vision, shortness of breath, chest pain, abdominal pain, problems with bowel movements, urination, or intercourse. No joint pain or mood swings.     Physical Exam:BP 112/76 (BP Location: Left Arm, Patient Position: Sitting, Cuff Size: Large)   Pulse 76   Ht 5' 9"$  (1.753 m)   Wt 244 lb (110.7 kg)   LMP 06/16/2022   Breastfeeding No   BMI 36.03 kg/m  UPT is +, about 3+5 weeks by LMP with EDD 03/23/23. General:  Well developed, well nourished, no acute distress Skin:  Warm and dry Neck:  Midline trachea, normal thyroid, good ROM, no lymphadenopathy Lungs; Clear to auscultation bilaterally Breast:  No dominant palpable mass, retraction, or nipple discharge Cardiovascular: Regular rate and rhythm Abdomen:  Soft, non tender, no hepatosplenomegaly Pelvic:  External genitalia is normal in appearance, no lesions.  The vagina is normal in appearance. Urethra has no lesions or masses. The cervix is bulbous.Pap with GC/CHL and HR HPV genotyping performed.  Uterus is felt to be normal size, shape, and contour.  No adnexal masses or tenderness noted.Bladder is non tender, no masses felt. Extremities/musculoskeletal:  No swelling or varicosities noted, no clubbing or cyanosis Psych:  No mood changes, alert and cooperative,seems happy AA is  4 Fall risk is low    07/12/2022    1:41 PM  Depression screen PHQ 2/9  Decreased Interest 1  Down, Depressed, Hopeless 0  PHQ - 2 Score 1  Altered sleeping 0  Tired, decreased energy 2  Change in appetite 2  Feeling bad or failure about yourself  0  Trouble concentrating 1  Moving slowly or fidgety/restless 0  Suicidal thoughts 0  PHQ-9 Score 6       07/12/2022    1:42 PM  GAD 7 : Generalized Anxiety Score  Nervous, Anxious, on Edge 0  Control/stop worrying 0  Worry too much - different things 0  Trouble relaxing 0  Restless 0  Easily annoyed or irritable 0  Afraid - awful might happen 0  Total GAD 7 Score 0      Upstream - 07/12/22 1340       Pregnancy Intention Screening   Does the patient want to become pregnant in the next year? N/A    Does the patient's partner want to become pregnant in the next year? N/A    Would the patient like to discuss contraceptive options today? Yes      Contraception Wrap Up   Current Method No Method - Other Reason    Reason for No Current Contraceptive Method at Intake ([facility] Only) Pregnant    Contraception Counseling Provided No            Examination chaperoned by Levy Pupa LPN   Impression and Plan: 1. Encounter for gynecological examination with Papanicolaou smear  of cervix Pap sent Pap in 3 years if normal  Physical in 1 year - Cytology - PAP( [facility])  2. Pregnancy examination or test, positive result - POCT urine pregnancy Will check labs - TSH + free T4 - Beta hCG quant (ref lab) - Progesterone Continue PNV Review OB packet  3. History of miscarriage, currently pregnant, first trimester History of 2 miscarriages. Will talk tomorrow, she is aware will check QHCG in 48 hours to see if numbers rising  Will check labs - TSH + free T4 - Beta hCG quant (ref lab) - Progesterone

## 2022-07-13 ENCOUNTER — Other Ambulatory Visit: Payer: Self-pay | Admitting: Adult Health

## 2022-07-13 DIAGNOSIS — Z3201 Encounter for pregnancy test, result positive: Secondary | ICD-10-CM

## 2022-07-13 LAB — TSH+FREE T4
Free T4: 1.06 ng/dL (ref 0.82–1.77)
TSH: 1.8 u[IU]/mL (ref 0.450–4.500)

## 2022-07-13 LAB — PROGESTERONE: Progesterone: 36 ng/mL

## 2022-07-13 LAB — BETA HCG QUANT (REF LAB): hCG Quant: 229 m[IU]/mL

## 2022-07-15 ENCOUNTER — Telehealth: Payer: Self-pay | Admitting: Adult Health

## 2022-07-15 NOTE — Telephone Encounter (Signed)
Pt does not want pregnancy, [facility]

## 2022-07-15 NOTE — Telephone Encounter (Signed)
Patient called to see what needs to be done going forward, she's not sure if she wants to continue pregnancy. Please advise.

## 2022-07-16 LAB — CYTOLOGY - PAP
Chlamydia: NEGATIVE
Comment: NEGATIVE
Comment: NEGATIVE
Comment: NORMAL
Diagnosis: NEGATIVE
High risk HPV: NEGATIVE
Neisseria Gonorrhea: NEGATIVE

## 2022-08-10 ENCOUNTER — Other Ambulatory Visit: Payer: Self-pay | Admitting: Obstetrics & Gynecology

## 2022-08-10 DIAGNOSIS — O3680X0 Pregnancy with inconclusive fetal viability, not applicable or unspecified: Secondary | ICD-10-CM

## 2022-08-11 ENCOUNTER — Ambulatory Visit (INDEPENDENT_AMBULATORY_CARE_PROVIDER_SITE_OTHER): Payer: 59

## 2022-08-11 DIAGNOSIS — O3680X0 Pregnancy with inconclusive fetal viability, not applicable or unspecified: Secondary | ICD-10-CM

## 2022-08-11 DIAGNOSIS — Z3A08 8 weeks gestation of pregnancy: Secondary | ICD-10-CM | POA: Diagnosis not present

## 2022-08-11 NOTE — Progress Notes (Signed)
[place] 8 wks,single IUP with yolk sac,FHR 164 bpm,normal ovaries,CRL 20.35 mm

## 2022-09-06 ENCOUNTER — Other Ambulatory Visit: Payer: Self-pay | Admitting: Adult Health

## 2022-09-06 MED ORDER — PRENATAL PLUS 27-1 MG PO TABS: 1.0000 | ORAL_TABLET | Freq: Every day | ORAL | 11 refills | Status: AC

## 2022-09-06 NOTE — Progress Notes (Signed)
Refill PNV 

## 2022-09-14 ENCOUNTER — Encounter: Payer: 59 | Admitting: Women's Health

## 2022-09-14 ENCOUNTER — Encounter: Payer: 59 | Admitting: *Deleted

## 2022-09-14 ENCOUNTER — Other Ambulatory Visit: Payer: 59

## 2022-09-15 ENCOUNTER — Other Ambulatory Visit: Payer: Self-pay | Admitting: Obstetrics & Gynecology

## 2022-09-15 DIAGNOSIS — Z3682 Encounter for antenatal screening for nuchal translucency: Secondary | ICD-10-CM

## 2022-09-16 ENCOUNTER — Ambulatory Visit (INDEPENDENT_AMBULATORY_CARE_PROVIDER_SITE_OTHER): Payer: 59

## 2022-09-16 ENCOUNTER — Encounter: Payer: Self-pay | Admitting: Advanced Practice Midwife

## 2022-09-16 ENCOUNTER — Ambulatory Visit (INDEPENDENT_AMBULATORY_CARE_PROVIDER_SITE_OTHER): Payer: 59 | Admitting: Advanced Practice Midwife

## 2022-09-16 ENCOUNTER — Encounter: Payer: 59 | Admitting: *Deleted

## 2022-09-16 VITALS — BP 106/71 | HR 76 | Wt 234.0 lb

## 2022-09-16 DIAGNOSIS — Z131 Encounter for screening for diabetes mellitus: Secondary | ICD-10-CM

## 2022-09-16 DIAGNOSIS — Z3480 Encounter for supervision of other normal pregnancy, unspecified trimester: Secondary | ICD-10-CM

## 2022-09-16 DIAGNOSIS — Z6836 Body mass index (BMI) 36.0-36.9, adult: Secondary | ICD-10-CM | POA: Diagnosis not present

## 2022-09-16 DIAGNOSIS — Z363 Encounter for antenatal screening for malformations: Secondary | ICD-10-CM

## 2022-09-16 DIAGNOSIS — Z3A13 13 weeks gestation of pregnancy: Secondary | ICD-10-CM

## 2022-09-16 DIAGNOSIS — Z3481 Encounter for supervision of other normal pregnancy, first trimester: Secondary | ICD-10-CM

## 2022-09-16 DIAGNOSIS — Z3682 Encounter for antenatal screening for nuchal translucency: Secondary | ICD-10-CM

## 2022-09-16 DIAGNOSIS — O09522 Supervision of elderly multigravida, second trimester: Secondary | ICD-10-CM

## 2022-09-16 DIAGNOSIS — Z3490 Encounter for supervision of normal pregnancy, unspecified, unspecified trimester: Secondary | ICD-10-CM | POA: Insufficient documentation

## 2022-09-16 DIAGNOSIS — E669 Obesity, unspecified: Secondary | ICD-10-CM | POA: Insufficient documentation

## 2022-09-16 NOTE — Patient Instructions (Signed)
Sharon Lutz, I greatly value your feedback.  If you receive a survey following your visit with [place] today, we appreciate you taking the time to fill it out.  Thanks, Sharon Beams, DNP, CNM  [facility] HAS MOVED!!! It is now [facility] ([address] [place], [place] [address]) Entrance located off of [address] Free 24/7 valet parking   Nausea & Vomiting Have saltine crackers or pretzels by your bed and eat a few bites before you raise your head out of bed in the morning Eat small frequent meals throughout the day instead of large meals Drink plenty of fluids throughout the day to stay hydrated, just don't drink a lot of fluids with your meals.  This can make your stomach fill up faster making you feel sick Do not brush your teeth right after you eat Products with real ginger are good for nausea, like ginger ale and ginger hard candy Make sure it says made with real ginger! Sucking on sour candy like lemon heads is also good for nausea If your prenatal vitamins make you nauseated, take them at night so you will sleep through the nausea Sea Bands If you feel like you need medicine for the nausea & vomiting please let us know If you are unable to keep any fluids or food down please let us know   Constipation Drink plenty of fluid, preferably water, throughout the day Eat foods high in fiber such as fruits, vegetables, and grains Exercise, such as walking, is a good way to keep your bowels regular Drink warm fluids, especially warm prune juice, or decaf coffee Eat a 1/2 cup of real oatmeal (not instant), 1/2 cup applesauce, and 1/2-1 cup warm prune juice every day If needed, you may take Colace (docusate sodium) stool softener once or twice a day to help keep the stool soft.  If you still are having problems with constipation, you may take Miralax once daily as needed to help keep your bowels regular.   Home Blood Pressure Monitoring  for Patients   Your provider has recommended that you check your blood pressure (BP) at least once a week at home. If you do not have a blood pressure cuff at home, one will be provided for you. Contact your provider if you have not received your monitor within 1 week.   Helpful Tips for Accurate Home Blood Pressure Checks  Don't smoke, exercise, or drink caffeine 30 minutes before checking your BP Use the restroom before checking your BP (a full bladder can raise your pressure) Relax in a comfortable upright chair Feet on the ground Left arm resting comfortably on a flat surface at the level of your heart Legs uncrossed Back supported Sit quietly and don't talk Place the cuff on your bare arm Adjust snuggly, so that only two fingertips can fit between your skin and the top of the cuff Check 2 readings separated by at least one minute Keep a log of your BP readings For a visual, please reference this diagram: http://ccnc.care/bpdiagram  Provider Name: [facility]     Phone: [phone]  Zone 1: ALL CLEAR  Continue to monitor your symptoms:  BP reading is less than 140 (top number) or less than 90 (bottom number)  No right upper stomach pain No headaches or seeing spots No feeling nauseated or throwing up No swelling in face and hands  Zone 2: CAUTION Call your doctor's office for any of the following:  BP reading is greater than 140 (top number) or greater than 90 (bottom number)  Stomach pain under your ribs in the middle or right side Headaches or seeing spots Feeling nauseated or throwing up Swelling in face and hands  Zone 3: EMERGENCY  Seek immediate medical care if you have any of the following:  BP reading is greater than160 (top number) or greater than 110 (bottom number) Severe headaches not improving with Tylenol Serious difficulty catching your breath Any worsening symptoms from Zone 2    First Trimester of Pregnancy The first trimester of pregnancy is  from week 1 until the end of week 12 (months 1 through 3). A week after a sperm fertilizes an egg, the egg will implant on the wall of the uterus. This embryo will begin to develop into a baby. Genes from you and your partner are forming the baby. The female genes determine whether the baby is a boy or a girl. At 6-8 weeks, the eyes and face are formed, and the heartbeat can be seen on ultrasound. At the end of 12 weeks, all the baby's organs are formed.  Now that you are pregnant, you will want to do everything you can to have a healthy baby. Two of the most important things are to get good prenatal care and to follow your health care provider's instructions. Prenatal care is all the medical care you receive before the baby's birth. This care will help prevent, find, and treat any problems during the pregnancy and childbirth. BODY CHANGES Your body goes through many changes during pregnancy. The changes vary from woman to woman.  You may gain or lose a couple of pounds at first. You may feel sick to your stomach (nauseous) and throw up (vomit). If the vomiting is uncontrollable, call your health care provider. You may tire easily. You may develop headaches that can be relieved by medicines approved by your health care provider. You may urinate more often. Painful urination may mean you have a bladder infection. You may develop heartburn as a result of your pregnancy. You may develop constipation because certain hormones are causing the muscles that push waste through your intestines to slow down. You may develop hemorrhoids or swollen, bulging veins (varicose veins). Your breasts may begin to grow larger and become tender. Your nipples may stick out more, and the tissue that surrounds them (areola) may become darker. Your gums may bleed and may be sensitive to brushing and flossing. Dark spots or blotches (chloasma, mask of pregnancy) may develop on your face. This will likely fade after the baby is  born. Your menstrual periods will stop. You may have a loss of appetite. You may develop cravings for certain kinds of food. You may have changes in your emotions from day to day, such as being excited to be pregnant or being concerned that something may go wrong with the pregnancy and baby. You may have more vivid and strange dreams. You may have changes in your hair. These can include thickening of your hair, rapid growth, and changes in texture. Some women also have hair loss during or after pregnancy, or hair that feels dry or thin. Your hair will most likely return to normal after your baby is born. WHAT TO EXPECT AT YOUR PRENATAL VISITS During a routine prenatal visit: You will be weighed to make sure you and the baby are growing normally. Your blood pressure will be taken. Your abdomen will be measured to track your baby's growth. The fetal  heartbeat will be listened to starting around week 10 or 12 of your pregnancy. Test results from any previous visits will be discussed. Your health care provider may ask you: How you are feeling. If you are feeling the baby move. If you have had any abnormal symptoms, such as leaking fluid, bleeding, severe headaches, or abdominal cramping. If you have any questions. Other tests that may be performed during your first trimester include: Blood tests to find your blood type and to check for the presence of any previous infections. They will also be used to check for low iron levels (anemia) and Rh antibodies. Later in the pregnancy, blood tests for diabetes will be done along with other tests if problems develop. Urine tests to check for infections, diabetes, or protein in the urine. An ultrasound to confirm the proper growth and development of the baby. An amniocentesis to check for possible genetic problems. Fetal screens for spina bifida and Down syndrome. You may need other tests to make sure you and the baby are doing well. HOME CARE  INSTRUCTIONS  Medicines Follow your health care provider's instructions regarding medicine use. Specific medicines may be either safe or unsafe to take during pregnancy. Take your prenatal vitamins as directed. If you develop constipation, try taking a stool softener if your health care provider approves. Diet Eat regular, well-balanced meals. Choose a variety of foods, such as meat or vegetable-based protein, fish, milk and low-fat dairy products, vegetables, fruits, and whole grain breads and cereals. Your health care provider will help you determine the amount of weight gain that is right for you. Avoid raw meat and uncooked cheese. These carry germs that can cause birth defects in the baby. Eating four or five small meals rather than three large meals a day may help relieve nausea and vomiting. If you start to feel nauseous, eating a few soda crackers can be helpful. Drinking liquids between meals instead of during meals also seems to help nausea and vomiting. If you develop constipation, eat more high-fiber foods, such as fresh vegetables or fruit and whole grains. Drink enough fluids to keep your urine clear or pale yellow. Activity and Exercise Exercise only as directed by your health care provider. Exercising will help you: Control your weight. Stay in shape. Be prepared for labor and delivery. Experiencing pain or cramping in the lower abdomen or low back is a good sign that you should stop exercising. Check with your health care provider before continuing normal exercises. Try to avoid standing for long periods of time. Move your legs often if you must stand in one place for a long time. Avoid heavy lifting. Wear low-heeled shoes, and practice good posture. You may continue to have sex unless your health care provider directs you otherwise. Relief of Pain or Discomfort Wear a good support bra for breast tenderness.   Take warm sitz baths to soothe any pain or discomfort caused by  hemorrhoids. Use hemorrhoid cream if your health care provider approves.   Rest with your legs elevated if you have leg cramps or low back pain. If you develop varicose veins in your legs, wear support hose. Elevate your feet for 15 minutes, 3-4 times a day. Limit salt in your diet. Prenatal Care Schedule your prenatal visits by the twelfth week of pregnancy. They are usually scheduled monthly at first, then more often in the last 2 months before delivery. Write down your questions. Take them to your prenatal visits. Keep all your prenatal visits as directed  by your health care provider. Safety Wear your seat belt at all times when driving. Make a list of emergency phone numbers, including numbers for family, friends, the hospital, and police and fire departments. General Tips Ask your health care provider for a referral to a local prenatal education class. Begin classes no later than at the beginning of month 6 of your pregnancy. Ask for help if you have counseling or nutritional needs during pregnancy. Your health care provider can offer advice or refer you to specialists for help with various needs. Do not use hot tubs, steam rooms, or saunas. Do not douche or use tampons or scented sanitary pads. Do not cross your legs for long periods of time. Avoid cat litter boxes and soil used by cats. These carry germs that can cause birth defects in the baby and possibly loss of the fetus by miscarriage or stillbirth. Avoid all smoking, herbs, alcohol, and medicines not prescribed by your health care provider. Chemicals in these affect the formation and growth of the baby. Schedule a dentist appointment. At home, brush your teeth with a soft toothbrush and be gentle when you floss. SEEK MEDICAL CARE IF:  You have dizziness. You have mild pelvic cramps, pelvic pressure, or nagging pain in the abdominal area. You have persistent nausea, vomiting, or diarrhea. You have a bad smelling vaginal  discharge. You have pain with urination. You notice increased swelling in your face, hands, legs, or ankles. SEEK IMMEDIATE MEDICAL CARE IF:  You have a fever. You are leaking fluid from your vagina. You have spotting or bleeding from your vagina. You have severe abdominal cramping or pain. You have rapid weight gain or loss. You vomit blood or material that looks like coffee grounds. You are exposed to [place] measles and have never had them. You are exposed to fifth disease or chickenpox. You develop a severe headache. You have shortness of breath. You have any kind of trauma, such as from a fall or a car accident. Document Released: 05/04/2001 Document Revised: 09/24/2013 Document Reviewed: 03/20/2013 [facility] Patient Information 2015 [place], [place]. This information is not intended to replace advice given to you by your health care provider. Make sure you discuss any questions you have with your health care provider.  ADDITIONAL HEALTHCARE OPTIONS FOR PATIENTS  [facility] / e-Visit: [email or web address]         [facility]: [phone]  [facility]: [phone]                   [facility]: [phone]     Safe Medications in Pregnancy   Acne: Benzoyl Peroxide Salicylic Acid  Backache/Headache: Tylenol: 2 regular strength every 4 hours OR              2 Extra strength every 6 hours  Colds/Coughs/Allergies: Benadryl (alcohol free) 25 mg every 6 hours as needed Breath right strips Claritin Cepacol throat lozenges Chloraseptic throat spray Cold-Eeze- up to three times per day Cough drops, alcohol free Flonase (by prescription only) Guaifenesin Mucinex Robitussin DM (plain only, alcohol free) Saline nasal spray/drops Sudafed (pseudoephedrine) & Actifed ** use only after [redacted] weeks gestation and if you do not have high blood pressure Tylenol Vicks Vaporub Zinc  lozenges Zyrtec   Constipation: Colace Ducolax suppositories Fleet enema Glycerin suppositories Metamucil Milk of magnesia Miralax Senokot Smooth move tea  Diarrhea: Kaopectate Imodium A-D  *NO pepto Bismol  Hemorrhoids: Anusol Anusol HC Preparation H Tucks  Indigestion: Tums Maalox  Mylanta Zantac  Pepcid  Insomnia: Benadryl (alcohol free) 25mg  every 6 hours as needed Tylenol PM Unisom, no Gelcaps  Leg Cramps: Tums MagGel  Nausea/Vomiting:  Bonine Dramamine Emetrol Ginger extract Sea bands Meclizine  Nausea medication to take during pregnancy:  Unisom (doxylamine succinate 25 mg tablets) Take one tablet daily at bedtime. If symptoms are not adequately controlled, the dose can be increased to a maximum recommended dose of two tablets daily (1/2 tablet in the morning, 1/2 tablet mid-afternoon and one at bedtime). Vitamin B6 100mg  tablets. Take one tablet twice a day (up to 200 mg per day).  Skin Rashes: Aveeno products Benadryl cream or 25mg  every 6 hours as needed Calamine Lotion 1% cortisone cream  Yeast infection: Gyne-lotrimin 7 Monistat 7   **If taking multiple medications, please check labels to avoid duplicating the same active ingredients **take medication as directed on the label ** Do not exceed 4000 mg of tylenol in 24 hours **Do not take medications that contain aspirin or ibuprofen

## 2022-09-16 NOTE — Progress Notes (Signed)
INITIAL OBSTETRICAL VISIT Patient name: Sharon Lutz MRN [id number]  Date of birth: Aug 01, 1985 Chief Complaint:   Initial Prenatal Visit  History of Present Illness:   Sharon Lutz is a 37 y.o. [id number] African American female at [redacted]w[redacted]d by LMP c/w u/s at 8 weeks with an Estimated Date of Delivery: 03/23/23 being seen today for her initial obstetrical visit.   Her obstetrical history is signweeksificant for 2 term SVDs, last one 2015, max weight 8# 14 oz.   Today she reports feeling better.  Hx ETOH abuse.  Doesn't drink at all now. .     09/16/2022    9:04 AM 07/12/2022    1:41 PM  Depression screen PHQ 2/9  Decreased Interest 1 1  Down, Depressed, Hopeless 0 0  PHQ - 2 Score 1 1  Altered sleeping 0 0  Tired, decreased energy 1 2  Change in appetite 1 2  Feeling bad or failure about yourself  0 0  Trouble concentrating 0 1  Moving slowly or fidgety/restless 0 0  Suicidal thoughts 0 0  PHQ-9 Score 3 6    Patient's last menstrual period was 06/16/2022. Last pap  Diagnosis  Date Value Ref Range Status  07/12/2022   Final   - Negative for intraepithelial lesion or malignancy (NILM)   Review of Systems:   Pertinent items are noted in HPI Denies cramping/contractions, leakage of fluid, vaginal bleeding, abnormal vaginal discharge w/ itching/odor/irritation, headaches, visual changes, shortness of breath, chest pain, abdominal pain, severe nausea/vomiting, or problems with urination or bowel movements unless otherwise stated above.  Pertinent History Reviewed:  Reviewed past medical,surgical, social, obstetrical and family history.  Reviewed problem list, medications and allergies. OB History  Gravida Para Term Preterm AB Living  SAB IAB Ectopic Multiple Live Births  2       2    # Outcome Date GA Lbr Len/2nd Weight Sex Delivery Anes PTL Lv  5 Current           4 SAB 04/2022          3 SAB 06/2020          2 Term 09/01/13 [redacted]w[redacted]d 12:31 / 01:20 8 lb  14 oz (4.026 kg) M Vag-Spont EPI  LIV  1 Term 06/30/07 [redacted]w[redacted]d  8 lb (3.629 kg) F Vag-Spont EPI N LIV   Physical Assessment:   Vitals:   09/16/22 0911 09/16/22 0933  BP:  106/71  Pulse:  76  Weight: 234 lb (106.1 kg) 234 lb (106.1 kg)  Body mass index is 34.56 kg/m.       Physical Examination:  General appearance - well appearing, and in no distress  Mental status - alert, oriented to person, place, and time  Psych:  She has a normal mood and affect  Skin - warm and dry, normal color, no suspicious lesions noted  Chest - effort normal  Heart - normal rate and regular rhythm  Abdomen - soft, nontender  Extremities:  No swelling or varicosities noted   TODAY'S NT [place] 13+1 wks,CRL 81.31 mm,NB present,NT 2.4 mm,normal ovaries,FHR 144 bpm,posterior placenta   No results found for this or any previous visit (from the past 24 hour(s)).   Indications for ASA therapy (per uptodate)  Two or more of the following: Nulliparity No Obesity (body mass index >30 kg/m2) Yes Age ?35 years Yes Indications for early GDM screening   BMI >30kg/m2 Yes Age > 35 Yes  09/16/2022    9:04 AM 07/12/2022    1:41 PM  Depression screen PHQ 2/9  Decreased Interest 1 1  Down, Depressed, Hopeless 0 0  PHQ - 2 Score 1 1  Altered sleeping 0 0  Tired, decreased energy 1 2  Change in appetite 1 2  Feeling bad or failure about yourself  0 0  Trouble concentrating 0 1  Moving slowly or fidgety/restless 0 0  Suicidal thoughts 0 0  PHQ-9 Score 3 6        07/12/2022    1:42 PM  GAD 7 : Generalized Anxiety Score  Nervous, Anxious, on Edge 0  Control/stop worrying 0  Worry too much - different things 0  Trouble relaxing 0  Restless 0  Easily annoyed or irritable 0  Afraid - awful might happen 0  Total GAD 7 Score 0      Assessment & Plan:  1) Low-Risk Pregnancy [id number] at 105w1d with an Estimated Date of Delivery: 03/23/23   2) Initial OB visit    1. Supervision of other normal pregnancy,  antepartum  - Integrated 1 - Urine Culture - CHL AMB BABYSCRIPTS SCHEDULE OPTIMIZATION - CBC/D/Plt+RPR+Rh+ABO+RubIgG... - PANORAMA PRENATAL TEST FULL PANEL - HORIZON CUSTOM - GC/Chlamydia Probe Amp - Hemoglobin A1c  2. [redacted] weeks gestation of pregnancy  - Integrated 1 - Urine Culture - CHL AMB BABYSCRIPTS SCHEDULE OPTIMIZATION - CBC/D/Plt+RPR+Rh+ABO+RubIgG... - PANORAMA PRENATAL TEST FULL PANEL - HORIZON CUSTOM - GC/Chlamydia Probe Amp - Hemoglobin A1c  3. Adult body mass index 36.0-36.9 EFW 32 weeks, no testing, IOL 39-40 weeks - Hemoglobin A1c  4. Diabetes mellitus screening  - Hemoglobin A1c  5. Antenatal screening for malformation using ultrasonics  - US OB Comp + 14 Wk; Future       Meds: No orders of the defined types were placed in this encounter.   Initial labs obtained Continue prenatal vitamins Reviewed n/v relief measures and warning s/s to report Reviewed recommended weight gain based on pre-gravid BMI Encouraged well-balanced diet Genetic & carrier screening discussed: requests Panorama, NT/IT, and Horizon , declines AFP Ultrasound discussed; fetal survey: requested CCNC completed> form faxed if has or is planning to apply for medicaid The nature of [facility] [employer] with multiple MDs and other Advanced Practice Providers was explained to patient; also emphasized that fellows, residents, and students are part of our team. Has home bp cuff.. Check bp weekly, let us know if >140/90.        Sharon Lutz 10:00 AM

## 2022-09-16 NOTE — Progress Notes (Signed)
[place] 13+1 wks,CRL 81.31 mm,NB present,NT 2.4 mm,normal ovaries,FHR 144 bpm,posterior placenta

## 2022-09-18 ENCOUNTER — Encounter: Payer: Self-pay | Admitting: Advanced Practice Midwife

## 2022-09-18 LAB — INTEGRATED 1
Crown Rump Length: 81.3 mm
Gest. Age on Collection Date: 13.7 weeks
Maternal Age at EDD: 36.8 yr
Nuchal Translucency (NT): 2.4 mm
Number of Fetuses: 1
PAPP-A Value: 1868.7 ng/mL
Weight: 234 [lb_av]

## 2022-09-18 LAB — CBC/D/PLT+RPR+RH+ABO+RUBIGG...
Antibody Screen: NEGATIVE
Basophils Absolute: 0 10*3/uL (ref 0.0–0.2)
Basos: 0 %
EOS (ABSOLUTE): 0 10*3/uL (ref 0.0–0.4)
Eos: 1 %
HCV Ab: NONREACTIVE
HIV Screen 4th Generation wRfx: NONREACTIVE
Hematocrit: 37.2 % (ref 34.0–46.6)
Hemoglobin: 12.2 g/dL (ref 11.1–15.9)
Hepatitis B Surface Ag: NEGATIVE
Immature Grans (Abs): 0 10*3/uL (ref 0.0–0.1)
Immature Granulocytes: 0 %
Lymphocytes Absolute: 1 10*3/uL (ref 0.7–3.1)
Lymphs: 22 %
MCH: 29.1 pg (ref 26.6–33.0)
MCHC: 32.8 g/dL (ref 31.5–35.7)
MCV: 89 fL (ref 79–97)
Monocytes Absolute: 0.2 10*3/uL (ref 0.1–0.9)
Monocytes: 4 %
Neutrophils Absolute: 3.4 10*3/uL (ref 1.4–7.0)
Neutrophils: 73 %
Platelets: 145 10*3/uL — ABNORMAL LOW (ref 150–450)
RBC: 4.19 x10E6/uL (ref 3.77–5.28)
RDW: 12.4 % (ref 11.7–15.4)
RPR Ser Ql: NONREACTIVE
Rh Factor: POSITIVE
Rubella Antibodies, IGG: 1.43 index (ref >0.99)
WBC: 4.7 10*3/uL (ref 3.4–10.8)

## 2022-09-18 LAB — URINE CULTURE

## 2022-09-18 LAB — HEMOGLOBIN A1C
Est. average glucose Bld gHb Est-mCnc: 111 mg/dL
Hgb A1c MFr Bld: 5.5 % (ref 4.8–5.6)

## 2022-09-18 LAB — HCV INTERPRETATION

## 2022-09-19 LAB — GC/CHLAMYDIA PROBE AMP
Chlamydia trachomatis, NAA: NEGATIVE
Neisseria Gonorrhoeae by PCR: NEGATIVE

## 2022-09-22 ENCOUNTER — Other Ambulatory Visit: Payer: 59

## 2022-09-22 ENCOUNTER — Encounter: Payer: 59 | Admitting: *Deleted

## 2022-09-22 ENCOUNTER — Encounter: Payer: 59 | Admitting: Advanced Practice Midwife

## 2022-09-24 LAB — PANORAMA PRENATAL TEST FULL PANEL:PANORAMA TEST PLUS 5 ADDITIONAL MICRODELETIONS: FETAL FRACTION: 10.2

## 2022-09-27 LAB — HORIZON CUSTOM: REPORT SUMMARY: POSITIVE — AB

## 2022-09-30 ENCOUNTER — Telehealth: Payer: Self-pay | Admitting: Advanced Practice Midwife

## 2022-09-30 ENCOUNTER — Encounter: Payer: Self-pay | Admitting: Advanced Practice Midwife

## 2022-09-30 DIAGNOSIS — D563 Thalassemia minor: Secondary | ICD-10-CM | POA: Insufficient documentation

## 2022-09-30 DIAGNOSIS — Z148 Genetic carrier of other disease: Secondary | ICD-10-CM | POA: Insufficient documentation

## 2022-09-30 NOTE — Telephone Encounter (Signed)
Pt states she would like to speak to someone about her results.

## 2022-10-07 ENCOUNTER — Ambulatory Visit (INDEPENDENT_AMBULATORY_CARE_PROVIDER_SITE_OTHER): Payer: Medicaid Other | Admitting: Advanced Practice Midwife

## 2022-10-07 ENCOUNTER — Encounter: Payer: Self-pay | Admitting: Advanced Practice Midwife

## 2022-10-07 VITALS — BP 105/67 | HR 88 | Wt 238.0 lb

## 2022-10-07 DIAGNOSIS — Z1379 Encounter for other screening for genetic and chromosomal anomalies: Secondary | ICD-10-CM

## 2022-10-07 DIAGNOSIS — Z148 Genetic carrier of other disease: Secondary | ICD-10-CM

## 2022-10-07 DIAGNOSIS — Z3A16 16 weeks gestation of pregnancy: Secondary | ICD-10-CM

## 2022-10-07 DIAGNOSIS — Z3480 Encounter for supervision of other normal pregnancy, unspecified trimester: Secondary | ICD-10-CM

## 2022-10-07 NOTE — Patient Instructions (Signed)
Sharon Lutz, I greatly value your feedback.  If you receive a survey following your visit with [place] today, we appreciate you taking the time to fill it out.  Thanks, [name], CNM     [facility] HAS MOVED!!! It is now [facility] ([address] [place], [place] [address]) Entrance located off of [address] Free 24/7 valet parking   Go to [email or web address] to register for FREE online childbirth classes    Second Trimester of Pregnancy The second trimester is from week 14 through week 27 (months 4 through 6). The second trimester is often a time when you feel your best. Your body has adjusted to being pregnant, and you begin to feel better physically. Usually, morning sickness has lessened or quit completely, you may have more energy, and you may have an increase in appetite. The second trimester is also a time when the fetus is growing rapidly. At the end of the sixth month, the fetus is about 9 inches long and weighs about 1 pounds. You will likely begin to feel the baby move (quickening) between 16 and 20 weeks of pregnancy. Body changes during your second trimester Your body continues to go through many changes during your second trimester. The changes vary from woman to woman. Your weight will continue to increase. You will notice your lower abdomen bulging out. You may begin to get stretch marks on your hips, abdomen, and breasts. You may develop headaches that can be relieved by medicines. The medicines should be approved by your health care provider. You may urinate more often because the fetus is pressing on your bladder. You may develop or continue to have heartburn as a result of your pregnancy. You may develop constipation because certain hormones are causing the muscles that push waste through your intestines to slow down. You may develop hemorrhoids or swollen, bulging veins (varicose veins). You may have back pain.  This is caused by: Weight gain. Pregnancy hormones that are relaxing the joints in your pelvis. A shift in weight and the muscles that support your balance. Your breasts will continue to grow and they will continue to become tender. Your gums may bleed and may be sensitive to brushing and flossing. Dark spots or blotches (chloasma, mask of pregnancy) may develop on your face. This will likely fade after the baby is born. A dark line from your belly button to the pubic area (linea nigra) may appear. This will likely fade after the baby is born. You may have changes in your hair. These can include thickening of your hair, rapid growth, and changes in texture. Some women also have hair loss during or after pregnancy, or hair that feels dry or thin. Your hair will most likely return to normal after your baby is born.  What to expect at prenatal visits During a routine prenatal visit: You will be weighed to make sure you and the fetus are growing normally. Your blood pressure will be taken. Your abdomen will be measured to track your baby's growth. The fetal heartbeat will be listened to. Any test results from the previous visit will be discussed.  Your health care provider may ask you: How you are feeling. If you are feeling the baby move. If you have had any abnormal symptoms, such as leaking fluid, bleeding, severe headaches, or abdominal cramping. If you are using any tobacco products, including cigarettes, chewing tobacco, and electronic cigarettes. If you have any questions.  Other tests  that may be performed during your second trimester include: Blood tests that check for: Low iron levels (anemia). High blood sugar that affects pregnant women (gestational diabetes) between 54 and 28 weeks. Rh antibodies. This is to check for a protein on red blood cells (Rh factor). Urine tests to check for infections, diabetes, or protein in the urine. An ultrasound to confirm the proper growth and  development of the baby. An amniocentesis to check for possible genetic problems. Fetal screens for spina bifida and Down syndrome. HIV (human immunodeficiency virus) testing. Routine prenatal testing includes screening for HIV, unless you choose not to have this test.  Follow these instructions at home: Medicines Follow your health care provider's instructions regarding medicine use. Specific medicines may be either safe or unsafe to take during pregnancy. Take a prenatal vitamin that contains at least 600 micrograms (mcg) of folic acid. If you develop constipation, try taking a stool softener if your health care provider approves. Eating and drinking Eat a balanced diet that includes fresh fruits and vegetables, whole grains, good sources of protein such as meat, eggs, or tofu, and low-fat dairy. Your health care provider will help you determine the amount of weight gain that is right for you. Avoid raw meat and uncooked cheese. These carry germs that can cause birth defects in the baby. If you have low calcium intake from food, talk to your health care provider about whether you should take a daily calcium supplement. Limit foods that are high in fat and processed sugars, such as fried and sweet foods. To prevent constipation: Drink enough fluid to keep your urine clear or pale yellow. Eat foods that are high in fiber, such as fresh fruits and vegetables, whole grains, and beans. Activity Exercise only as directed by your health care provider. Most women can continue their usual exercise routine during pregnancy. Try to exercise for 30 minutes at least 5 days a week. Stop exercising if you experience uterine contractions. Avoid heavy lifting, wear low heel shoes, and practice good posture. A sexual relationship may be continued unless your health care provider directs you otherwise. Relieving pain and discomfort Wear a good support bra to prevent discomfort from breast tenderness. Take  warm sitz baths to soothe any pain or discomfort caused by hemorrhoids. Use hemorrhoid cream if your health care provider approves. Rest with your legs elevated if you have leg cramps or low back pain. If you develop varicose veins, wear support hose. Elevate your feet for 15 minutes, 3-4 times a day. Limit salt in your diet. Prenatal Care Write down your questions. Take them to your prenatal visits. Keep all your prenatal visits as told by your health care provider. This is important. Safety Wear your seat belt at all times when driving. Make a list of emergency phone numbers, including numbers for family, friends, the hospital, and police and fire departments. General instructions Ask your health care provider for a referral to a local prenatal education class. Begin classes no later than the beginning of month 6 of your pregnancy. Ask for help if you have counseling or nutritional needs during pregnancy. Your health care provider can offer advice or refer you to specialists for help with various needs. Do not use hot tubs, steam rooms, or saunas. Do not douche or use tampons or scented sanitary pads. Do not cross your legs for long periods of time. Avoid cat litter boxes and soil used by cats. These carry germs that can cause birth defects in the baby  and possibly loss of the fetus by miscarriage or stillbirth. Avoid all smoking, herbs, alcohol, and unprescribed drugs. Chemicals in these products can affect the formation and growth of the baby. Do not use any products that contain nicotine or tobacco, such as cigarettes and e-cigarettes. If you need help quitting, ask your health care provider. Visit your dentist if you have not gone yet during your pregnancy. Use a soft toothbrush to brush your teeth and be gentle when you floss. Contact a health care provider if: You have dizziness. You have mild pelvic cramps, pelvic pressure, or nagging pain in the abdominal area. You have persistent  nausea, vomiting, or diarrhea. You have a bad smelling vaginal discharge. You have pain when you urinate. Get help right away if: You have a fever. You are leaking fluid from your vagina. You have spotting or bleeding from your vagina. You have severe abdominal cramping or pain. You have rapid weight gain or weight loss. You have shortness of breath with chest pain. You notice sudden or extreme swelling of your face, hands, ankles, feet, or legs. You have not felt your baby move in over an hour. You have severe headaches that do not go away when you take medicine. You have vision changes. Summary The second trimester is from week 14 through week 27 (months 4 through 6). It is also a time when the fetus is growing rapidly. Your body goes through many changes during pregnancy. The changes vary from woman to woman. Avoid all smoking, herbs, alcohol, and unprescribed drugs. These chemicals affect the formation and growth your baby. Do not use any tobacco products, such as cigarettes, chewing tobacco, and e-cigarettes. If you need help quitting, ask your health care provider. Contact your health care provider if you have any questions. Keep all prenatal visits as told by your health care provider. This is important. This information is not intended to replace advice given to you by your health care provider. Make sure you discuss any questions you have with your health care provider.

## 2022-10-07 NOTE — Progress Notes (Signed)
   LOW-RISK PREGNANCY VISIT Patient name: Sharon Lutz MRN [id number]  Date of birth: 1985-07-31 Chief Complaint:   Routine Prenatal Visit (2nd IT)  History of Present Illness:   Sharon Lutz is a 37 y.o. [phone] female at [redacted]w[redacted]d with an Estimated Date of Delivery: 03/23/23 being seen today for ongoing management of a low-risk pregnancy.  Today she reports no complaints.  .  .   . denies leaking of fluid. Review of Systems:   Pertinent items are noted in HPI Denies abnormal vaginal discharge w/ itching/odor/irritation, headaches, visual changes, shortness of breath, chest pain, abdominal pain, severe nausea/vomiting, or problems with urination or bowel movements unless otherwise stated above. Pertinent History Reviewed:  Reviewed past medical,surgical, social, obstetrical and family history.  Reviewed problem list, medications and allergies. Physical Assessment:   Vitals:   10/07/22 1135  BP: 105/67  Pulse: 88  Weight: 238 lb (108 kg)  Body mass index is 35.15 kg/m.        Physical Examination:   General appearance: Well appearing, and in no distress  Mental status: Alert, oriented to person, place, and time  Skin: Warm & dry  Cardiovascular: Normal heart rate noted  Respiratory: Normal respiratory effort, no distress  Abdomen: Soft, gravid, nontender  Pelvic: Cervical exam deferred         Extremities:    Fetal Status: Fetal Heart Rate (bpm): 152        Chaperone:  N/A    No results found for this or any previous visit (from the past 24 hour(s)).  Assessment & Plan:    Pregnancy: [id number] at [redacted]w[redacted]d 1. Supervision of other normal pregnancy, antepartum   2. Genetic testing  - [facility]TEGR[facility] 2  3. [redacted] weeks gestation of pregnancy      Meds: No orders of the defined types were placed in this encounter.   Plan:  Continue routine obstetrical care  Next visit: prefers in person    Reviewed:  general obstetric precautions including but not limited to  vaginal bleeding, contractions, leaking of fluid and fetal movement were reviewed in detail with the patient.  All questions were answered. Has home bp cuff.. Check bp weekly, let us know if >140/90.   Follow-up: Return for As scheduled.  Future Appointments  Date Time Provider Department Center  10/28/2022  1:30 PM [facility] [place] [facility] None  10/28/2022  2:30 PM Arabella Merles, CNM [facility]    Orders Placed This Encounter  Procedures   [facility]GR[facility] 2   Jacklyn Shell DNP, CNM 10/07/2022 12:08 PM

## 2022-10-09 LAB — INTEGRATED 2
AFP MoM: 1.17
Alpha-Fetoprotein: 31.4 ng/mL
Crown Rump Length: 81.3 mm
DIA MoM: 0.84
DIA Value: 100 pg/mL
Estriol, Unconjugated: 1.09 ng/mL
Gest. Age on Collection Date: 13.7 weeks
Gestational Age: 16.7 weeks
Maternal Age at EDD: 36.8 yr
Nuchal Translucency (NT): 2.4 mm
Nuchal Translucency MoM: 1.24
Number of Fetuses: 1
PAPP-A MoM: 2.05
PAPP-A Value: 1868.7 ng/mL
Test Results:: NEGATIVE
Weight: 234 [lb_av]
Weight: 234 [lb_av]
hCG MoM: 2.55
hCG Value: 59.2 IU/mL
uE3 MoM: 1.13

## 2022-10-13 ENCOUNTER — Encounter: Payer: Self-pay | Admitting: Advanced Practice Midwife

## 2022-10-21 DIAGNOSIS — Z3483 Encounter for supervision of other normal pregnancy, third trimester: Secondary | ICD-10-CM | POA: Diagnosis not present

## 2022-10-21 DIAGNOSIS — Z3482 Encounter for supervision of other normal pregnancy, second trimester: Secondary | ICD-10-CM | POA: Diagnosis not present

## 2022-10-26 ENCOUNTER — Ambulatory Visit (INDEPENDENT_AMBULATORY_CARE_PROVIDER_SITE_OTHER): Payer: 59

## 2022-10-26 ENCOUNTER — Encounter: Payer: Self-pay | Admitting: Women's Health

## 2022-10-26 ENCOUNTER — Encounter: Payer: 59 | Admitting: Advanced Practice Midwife

## 2022-10-26 ENCOUNTER — Ambulatory Visit (INDEPENDENT_AMBULATORY_CARE_PROVIDER_SITE_OTHER): Payer: 59 | Admitting: Women's Health

## 2022-10-26 VITALS — BP 101/66 | HR 69 | Wt 237.0 lb

## 2022-10-26 DIAGNOSIS — Z3A18 18 weeks gestation of pregnancy: Secondary | ICD-10-CM

## 2022-10-26 DIAGNOSIS — Z363 Encounter for antenatal screening for malformations: Secondary | ICD-10-CM | POA: Diagnosis not present

## 2022-10-26 DIAGNOSIS — Z3482 Encounter for supervision of other normal pregnancy, second trimester: Secondary | ICD-10-CM

## 2022-10-26 DIAGNOSIS — E669 Obesity, unspecified: Secondary | ICD-10-CM

## 2022-10-26 DIAGNOSIS — Z3480 Encounter for supervision of other normal pregnancy, unspecified trimester: Secondary | ICD-10-CM

## 2022-10-26 NOTE — Progress Notes (Signed)
LOW-RISK PREGNANCY VISIT Patient name: Sharon Lutz MRN [id number]  Date of birth: 05/26/1985 Chief Complaint:   Routine Prenatal Visit and Pregnancy Ultrasound  History of Present Illness:   Sharon Lutz is a 37 y.o. [id number] female at [redacted]w[redacted]d with an Estimated Date of Delivery: 03/23/23 being seen today for ongoing management of a low-risk pregnancy.   Today she reports pelvic pressure. Needs physical form filled out for [employer] school-forms filled out. Contractions: Not present.  .  Movement: Present. denies leaking of fluid.     09/16/2022    9:04 AM 07/12/2022    1:41 PM  Depression screen PHQ 2/9  Decreased Interest 1 1  Down, Depressed, Hopeless 0 0  PHQ - 2 Score 1 1  Altered sleeping 0 0  Tired, decreased energy 1 2  Change in appetite 1 2  Feeling bad or failure about yourself  0 0  Trouble concentrating 0 1  Moving slowly or fidgety/restless 0 0  Suicidal thoughts 0 0  PHQ-9 Score 3 6        07/12/2022    1:42 PM  GAD 7 : Generalized Anxiety Score  Nervous, Anxious, on Edge 0  Control/stop worrying 0  Worry too much - different things 0  Trouble relaxing 0  Restless 0  Easily annoyed or irritable 0  Afraid - awful might happen 0  Total GAD 7 Score 0      Review of Systems:   Pertinent items are noted in HPI Denies abnormal vaginal discharge w/ itching/odor/irritation, headaches, visual changes, shortness of breath, chest pain, abdominal pain, severe nausea/vomiting, or problems with urination or bowel movements unless otherwise stated above. Pertinent History Reviewed:  Reviewed past medical,surgical, social, obstetrical and family history.  Reviewed problem list, medications and allergies. Physical Assessment:   Vitals:   10/26/22 1141  BP: 101/66  Pulse: 69  Weight: 237 lb (107.5 kg)  Body mass index is 35 kg/m.        Physical Examination:   General appearance: Well appearing, and in no distress  Mental status: Alert, oriented to  person, place, and time  Skin: Warm & dry  Cardiovascular: Normal heart rate noted, HRRR  Respiratory: Normal respiratory effort, no distress, LCTAB  Abdomen: Soft, gravid, nontender  Pelvic: Cervical exam deferred         Extremities: Edema: None  Fetal Status:     Movement: Present  [place] 18+6 wks,cephalic,CX 4 cm,posterior placenta gr 0,normal ovaries,SVP of fluid 4.8 cm,FHR 148 bpm,EFW 329 g 96%,anatomy complete    Chaperone: N/A   No results found for this or any previous visit (from the past 24 hour(s)).  Assessment & Plan:  1) Low-risk pregnancy [id number] at [redacted]w[redacted]d with an Estimated Date of Delivery: 03/23/23   2) EFW 96%> h/o 8lb14oz baby, will repeat u/s ~36wks   Meds: No orders of the defined types were placed in this encounter.  Labs/procedures today: U/S  Plan:  Continue routine obstetrical care  Next visit: prefers online    Reviewed: Preterm labor symptoms and general obstetric precautions including but not limited to vaginal bleeding, contractions, leaking of fluid and fetal movement were reviewed in detail with the patient.  All questions were answered. Does have home bp cuff. Office bp cuff given: not applicable. Check bp weekly, let us know if consistently >140 and/or >90.  Follow-up: Return in about 4 weeks (around 11/23/2022) for LROB, CNM, MyChart Video.  Future Appointments  Date Time Provider Department Center  11/23/2022  11:50 AM Cheral Marker, CNM [facility]    No orders of the defined types were placed in this encounter.  Cheral Marker CNM, [facility] 10/26/2022 12:15 PM

## 2022-10-26 NOTE — Patient Instructions (Signed)
Sharon Lutz, thank you for choosing our office today! We appreciate the opportunity to meet your healthcare needs. You may receive a short survey by mail, e-mail, or through [employer]. If you are happy with your care we would appreciate if you could take just a few minutes to complete the survey questions. We read all of your comments and take your feedback very seriously. Thank you again for choosing our office.  Center for [employer] Team at [facility] at [facility] ([address] [place], [place] [address]) Entrance C, located off of [address] Free 24/7 valet parking  Go to [email or web address] to register for FREE online childbirth classes  Call the office [phone]) or go to [facility] if: You begin to severe cramping Your water breaks.  Sometimes it is a big gush of fluid, sometimes it is just a trickle that keeps getting your panties wet or running down your legs You have vaginal bleeding.  It is normal to have a small amount of spotting if your cervix was checked.   [facility] Pediatricians/Family Doctors [facility]): [address] Colette Ribas, [phone]           [facility]: [address], [phone]                [facility]): [address], [phone] (call to ask if accepting patients) [facility] Department: [address], [place], [phone]    [facility] Pediatricians/Family Doctors [facility]): [phone] [address], [phone] [facility]: [address] [place], [phone] [facility]: [address], [phone]  [facility] Doctors  [facility]): [phone] [facility]: [address] [phone]   [facility] Doctors [facility]: [address] [phone]  [facility] Doctors  [facility]: [phone], [phone]  Home Blood Pressure Monitoring for Patients   Your provider has recommended that you check your blood pressure (BP) at least once a week at home. If you do not have a blood pressure cuff at home, one will be provided for you. Contact your provider if you have not received your monitor within 1 week.   Helpful Tips for Accurate Home Blood Pressure Checks  Don't smoke, exercise, or drink caffeine 30 minutes before checking your BP Use the restroom before checking your BP (a full bladder can raise your pressure) Relax in a comfortable upright chair Feet on the ground Left arm resting comfortably on a flat surface at the level of your heart Legs uncrossed Back supported Sit quietly and don't talk Place the cuff on your bare arm Adjust snuggly, so that only two fingertips can fit between your skin and the top of the cuff Check 2 readings separated by at least one minute Keep a log of your BP readings For a visual, please reference this diagram: http://ccnc.care/bpdiagram  Provider Name: [facility]     Phone: [phone]  Zone 1: ALL CLEAR  Continue to monitor your symptoms:  BP reading is less than 140 (top number) or less than 90 (bottom number)  No right upper stomach pain No headaches or seeing spots No feeling nauseated or throwing up No swelling in face and hands  Zone 2: CAUTION Call your doctor's office for any of the following:  BP reading is greater than 140 (top number) or greater than  90 (bottom number)  Stomach pain under your ribs in the middle or right side Headaches or seeing spots Feeling nauseated or throwing up Swelling in face and hands  Zone 3: EMERGENCY  Seek immediate medical care if you have any of the following:  BP reading is greater than160 (top number) or greater than 110 (bottom number) Severe headaches not improving with Tylenol Serious difficulty catching your breath Any worsening symptoms from  Zone 2     Second Trimester of Pregnancy The second trimester is from week 14 through week 27 (months 4 through 6). The second trimester is often a time when you feel your best. Your body has adjusted to being pregnant, and you begin to feel better physically. Usually, morning sickness has lessened or quit completely, you may have more energy, and you may have an increase in appetite. The second trimester is also a time when the fetus is growing rapidly. At the end of the sixth month, the fetus is about 9 inches long and weighs about 1 pounds. You will likely begin to feel the baby move (quickening) between 16 and 20 weeks of pregnancy. Body changes during your second trimester Your body continues to go through many changes during your second trimester. The changes vary from woman to woman. Your weight will continue to increase. You will notice your lower abdomen bulging out. You may begin to get stretch marks on your hips, abdomen, and breasts. You may develop headaches that can be relieved by medicines. The medicines should be approved by your health care provider. You may urinate more often because the fetus is pressing on your bladder. You may develop or continue to have heartburn as a result of your pregnancy. You may develop constipation because certain hormones are causing the muscles that push waste through your intestines to slow down. You may develop hemorrhoids or swollen, bulging veins (varicose veins). You may have back pain. This is caused by: Weight gain. Pregnancy hormones that are relaxing the joints in your pelvis. A shift in weight and the muscles that support your balance. Your breasts will continue to grow and they will continue to become tender. Your gums may bleed and may be sensitive to brushing and flossing. Dark spots or blotches (chloasma, mask of pregnancy) may develop on your face. This will likely fade after the baby is born. A dark line from your belly button to  the pubic area (linea nigra) may appear. This will likely fade after the baby is born. You may have changes in your hair. These can include thickening of your hair, rapid growth, and changes in texture. Some women also have hair loss during or after pregnancy, or hair that feels dry or thin. Your hair will most likely return to normal after your baby is born.  What to expect at prenatal visits During a routine prenatal visit: You will be weighed to make sure you and the fetus are growing normally. Your blood pressure will be taken. Your abdomen will be measured to track your baby's growth. The fetal heartbeat will be listened to. Any test results from the previous visit will be discussed.  Your health care provider may ask you: How you are feeling. If you are feeling the baby move. If you have had any abnormal symptoms, such as leaking fluid, bleeding, severe headaches, or abdominal cramping. If you are using any tobacco products, including cigarettes, chewing tobacco, and electronic cigarettes. If you have any questions.  Other tests that may be performed during  your second trimester include: Blood tests that check for: Low iron levels (anemia). High blood sugar that affects pregnant women (gestational diabetes) between 41 and 28 weeks. Rh antibodies. This is to check for a protein on red blood cells (Rh factor). Urine tests to check for infections, diabetes, or protein in the urine. An ultrasound to confirm the proper growth and development of the baby. An amniocentesis to check for possible genetic problems. Fetal screens for spina bifida and Down syndrome. HIV (human immunodeficiency virus) testing. Routine prenatal testing includes screening for HIV, unless you choose not to have this test.  Follow these instructions at home: Medicines Follow your health care provider's instructions regarding medicine use. Specific medicines may be either safe or unsafe to take during  pregnancy. Take a prenatal vitamin that contains at least 600 micrograms (mcg) of folic acid. If you develop constipation, try taking a stool softener if your health care provider approves. Eating and drinking Eat a balanced diet that includes fresh fruits and vegetables, whole grains, good sources of protein such as meat, eggs, or tofu, and low-fat dairy. Your health care provider will help you determine the amount of weight gain that is right for you. Avoid raw meat and uncooked cheese. These carry germs that can cause birth defects in the baby. If you have low calcium intake from food, talk to your health care provider about whether you should take a daily calcium supplement. Limit foods that are high in fat and processed sugars, such as fried and sweet foods. To prevent constipation: Drink enough fluid to keep your urine clear or pale yellow. Eat foods that are high in fiber, such as fresh fruits and vegetables, whole grains, and beans. Activity Exercise only as directed by your health care provider. Most women can continue their usual exercise routine during pregnancy. Try to exercise for 30 minutes at least 5 days a week. Stop exercising if you experience uterine contractions. Avoid heavy lifting, wear low heel shoes, and practice good posture. A sexual relationship may be continued unless your health care provider directs you otherwise. Relieving pain and discomfort Wear a good support bra to prevent discomfort from breast tenderness. Take warm sitz baths to soothe any pain or discomfort caused by hemorrhoids. Use hemorrhoid cream if your health care provider approves. Rest with your legs elevated if you have leg cramps or low back pain. If you develop varicose veins, wear support hose. Elevate your feet for 15 minutes, 3-4 times a day. Limit salt in your diet. Prenatal Care Write down your questions. Take them to your prenatal visits. Keep all your prenatal visits as told by your health  care provider. This is important. Safety Wear your seat belt at all times when driving. Make a list of emergency phone numbers, including numbers for family, friends, the hospital, and police and fire departments. General instructions Ask your health care provider for a referral to a local prenatal education class. Begin classes no later than the beginning of month 6 of your pregnancy. Ask for help if you have counseling or nutritional needs during pregnancy. Your health care provider can offer advice or refer you to specialists for help with various needs. Do not use hot tubs, steam rooms, or saunas. Do not douche or use tampons or scented sanitary pads. Do not cross your legs for long periods of time. Avoid cat litter boxes and soil used by cats. These carry germs that can cause birth defects in the baby and possibly loss of the  fetus by miscarriage or stillbirth. Avoid all smoking, herbs, alcohol, and unprescribed drugs. Chemicals in these products can affect the formation and growth of the baby. Do not use any products that contain nicotine or tobacco, such as cigarettes and e-cigarettes. If you need help quitting, ask your health care provider. Visit your dentist if you have not gone yet during your pregnancy. Use a soft toothbrush to brush your teeth and be gentle when you floss. Contact a health care provider if: You have dizziness. You have mild pelvic cramps, pelvic pressure, or nagging pain in the abdominal area. You have persistent nausea, vomiting, or diarrhea. You have a bad smelling vaginal discharge. You have pain when you urinate. Get help right away if: You have a fever. You are leaking fluid from your vagina. You have spotting or bleeding from your vagina. You have severe abdominal cramping or pain. You have rapid weight gain or weight loss. You have shortness of breath with chest pain. You notice sudden or extreme swelling of your face, hands, ankles, feet, or legs. You  have not felt your baby move in over an hour. You have severe headaches that do not go away when you take medicine. You have vision changes. Summary The second trimester is from week 14 through week 27 (months 4 through 6). It is also a time when the fetus is growing rapidly. Your body goes through many changes during pregnancy. The changes vary from woman to woman. Avoid all smoking, herbs, alcohol, and unprescribed drugs. These chemicals affect the formation and growth your baby. Do not use any tobacco products, such as cigarettes, chewing tobacco, and e-cigarettes. If you need help quitting, ask your health care provider. Contact your health care provider if you have any questions. Keep all prenatal visits as told by your health care provider. This is important. This information is not intended to replace advice given to you by your health care provider. Make sure you discuss any questions you have with your health care provider. Document Released: 05/04/2001 Document Revised: 10/16/2015 Document Reviewed: 07/11/2012 Elsevier Interactive Patient Education  2017 [employer].

## 2022-10-26 NOTE — Progress Notes (Signed)
[place] 18+6 wks,cephalic,CX 4 cm,posterior placenta gr 0,normal ovaries,SVP of fluid 4.8 cm,FHR 148 bpm,EFW 329 g 96%,anatomy complete

## 2022-10-27 ENCOUNTER — Encounter: Payer: Self-pay | Admitting: Women's Health

## 2022-10-28 ENCOUNTER — Other Ambulatory Visit: Payer: 59

## 2022-10-28 ENCOUNTER — Encounter: Payer: 59 | Admitting: Advanced Practice Midwife

## 2022-11-08 ENCOUNTER — Encounter: Payer: Self-pay | Admitting: Women's Health

## 2022-11-18 ENCOUNTER — Encounter: Payer: Self-pay | Admitting: Women's Health

## 2022-11-23 ENCOUNTER — Encounter: Payer: Self-pay | Admitting: Women's Health

## 2022-11-23 ENCOUNTER — Telehealth (INDEPENDENT_AMBULATORY_CARE_PROVIDER_SITE_OTHER): Payer: 59 | Admitting: Women's Health

## 2022-11-23 ENCOUNTER — Other Ambulatory Visit: Payer: Self-pay | Admitting: Women's Health

## 2022-11-23 VITALS — BP 118/72 | HR 84 | Wt 237.0 lb

## 2022-11-23 DIAGNOSIS — Z3482 Encounter for supervision of other normal pregnancy, second trimester: Secondary | ICD-10-CM

## 2022-11-23 DIAGNOSIS — Z3480 Encounter for supervision of other normal pregnancy, unspecified trimester: Secondary | ICD-10-CM

## 2022-11-23 DIAGNOSIS — Z1159 Encounter for screening for other viral diseases: Secondary | ICD-10-CM

## 2022-11-23 DIAGNOSIS — Z3A22 22 weeks gestation of pregnancy: Secondary | ICD-10-CM

## 2022-11-23 NOTE — Progress Notes (Signed)
TELEHEALTH VIRTUAL OBSTETRICS VISIT ENCOUNTER NOTE Patient name: Sharon Lutz MRN [id number]  Date of birth: 01-03-1986  I connected with patient on 11/23/22 at 11:50 AM EDT by MyChart video  and verified that I am speaking with the correct person using two identifiers. Pt is not currently in our office, she is at work.  The provider is in the office.    I discussed the limitations, risks, security and privacy concerns of performing an evaluation and management service by telephone and the availability of in person appointments. I also discussed with the patient that there may be a patient responsible charge related to this service. The patient expressed understanding and agreed to proceed.  Chief Complaint:   Routine Prenatal Visit (My chart visit)  History of Present Illness:   Sharon Lutz is a 37 y.o. [phone] female at [redacted]w[redacted]d with an Estimated Date of Delivery: 03/23/23 being evaluated today for ongoing management of a low-risk pregnancy.     09/16/2022    9:04 AM 07/12/2022    1:41 PM  Depression screen PHQ 2/9  Decreased Interest 1 1  Down, Depressed, Hopeless 0 0  PHQ - 2 Score 1 1  Altered sleeping 0 0  Tired, decreased energy 1 2  Change in appetite 1 2  Feeling bad or failure about yourself  0 0  Trouble concentrating 0 1  Moving slowly or fidgety/restless 0 0  Suicidal thoughts 0 0  PHQ-9 Score 3 6    Today she reports no complaints. Starting school for C[employer], needs proof of varicella immunity asap.  Contractions: Not present.  .  Movement: Present. denies leaking of fluid. Review of Systems:   Pertinent items are noted in HPI Denies abnormal vaginal discharge w/ itching/odor/irritation, headaches, visual changes, shortness of breath, chest pain, abdominal pain, severe nausea/vomiting, or problems with urination or bowel movements unless otherwise stated above. Pertinent History Reviewed:  Reviewed past medical,surgical, social, obstetrical and family  history.  Reviewed problem list, medications and allergies. Physical Assessment:   Vitals:   11/23/22 1151  BP: 118/72  Pulse: 84  Weight: 237 lb (107.5 kg)  Body mass index is 35 kg/m.        Physical Examination:   General:  Alert, oriented and cooperative.   Mental Status: Normal mood and affect perceived. Normal judgment and thought content.  Rest of physical exam deferred due to type of encounter  No results found for this or any previous visit (from the past 24 hour(s)).  Assessment & Plan:  1) Pregnancy [id number] at [redacted]w[redacted]d with an Estimated Date of Delivery: 03/23/23   2) Screening for school, varicella antibody ordered, can come to [facility] to have drawn   Meds: No orders of the defined types were placed in this encounter.   Plan:  Continue routine obstetrical care.  Does have home bp cuff. Office bp cuff given: not applicable. Check bp weekly, let us know if consistently >140 and/or >90.  Next visit: prefers will be in person for pn2     Reviewed: Preterm labor symptoms and general obstetric precautions including but not limited to vaginal bleeding, contractions, leaking of fluid and fetal movement were reviewed in detail with the patient. The patient was advised to call back or seek an in-person office evaluation/go to [facility] at [facility] for any urgent or concerning symptoms. All questions were answered. Please refer to After Visit Summary for other counseling recommendations.    I provided 10 minutes of non-face-to-face time  during this encounter.  Follow-up: Return in about 4 weeks (around 12/21/2022) for LROB, PN2, CNM, in person.  Orders Placed This Encounter  Procedures   Varicella zoster antibody, IgG   Cheral Marker CNM, [facility] 11/23/2022 12:13 PM

## 2022-11-23 NOTE — Patient Instructions (Signed)
Deysha, thank you for choosing our office today! We appreciate the opportunity to meet your healthcare needs. You may receive a short survey by mail, e-mail, or through [employer]. If you are happy with your care we would appreciate if you could take just a few minutes to complete the survey questions. We read all of your comments and take your feedback very seriously. Thank you again for choosing our office.  Center for [employer] Team at [facility]  [facility] at [facility] ([address] [place], [place] [address]) Entrance C, located off of [address] Free 24/7 valet parking   You will have your sugar test next visit.  Please do not eat or drink anything after midnight the night before you come, not even water.  You will be here for at least two hours.  Please make an appointment online for the bloodwork at [email or web address] for 8:00am (or as close to this as possible). Make sure you select the [facility] service center.   CLASSES: Go to [email or web address] to register for classes (childbirth, breastfeeding, waterbirth, infant CPR, daddy bootcamp, etc.)  Call the office [phone]) or go to [facility] if: You begin to have strong, frequent contractions Your water breaks.  Sometimes it is a big gush of fluid, sometimes it is just a trickle that keeps getting your panties wet or running down your legs You have vaginal bleeding.  It is normal to have a small amount of spotting if your cervix was checked.  You don't feel your baby moving like normal.  If you don't, get you something to eat and drink and lay down and focus on feeling your baby move.   If your baby is still not moving like normal, you should call the office or go to [facility].  Call the office [phone]) or go to [facility] hospital for these signs of pre-eclampsia: Severe headache that does not go away with Tylenol Visual changes- seeing spots, double, blurred vision Pain under your right breast or upper  abdomen that does not go away with Tums or heartburn medicine Nausea and/or vomiting Severe swelling in your hands, feet, and face    [place] [facility]/Family Doctors [facility]): [address] Colette Ribas, [phone]           [facility]: [address], [phone]                [facility]): [address], [phone]  [facility] Department: [address], [place], [phone]    [facility] Pediatricians/Family Doctors [facility]): [address], [phone] [facility]: [address] [place], [phone] [facility]: [address], [phone]  [facility] Doctors  [facility]): [phone] [facility]: [address] [phone]   [facility] Doctors [facility]: [address], [phone]  [facility] Doctors  [facility]: [phone], [phone]  Home Blood Pressure Monitoring for Patients   Your provider has recommended that you check your blood pressure (BP) at least once a week at home. If you do not have a blood pressure cuff at home, one will be provided for you. Contact your provider if you have not received your monitor within 1 week.   Helpful Tips for Accurate Home Blood Pressure Checks  Don't smoke, exercise, or drink caffeine 30 minutes before checking  your BP Use the restroom before checking your BP (a full bladder can raise your pressure) Relax in a comfortable upright chair Feet on the ground Left arm resting comfortably on a flat surface at the level of your heart Legs uncrossed Back supported Sit quietly and don't talk Place the cuff on your bare arm Adjust snuggly, so that only two fingertips can fit between your skin and the top of the cuff Check 2 readings separated by at least one  minute Keep a log of your BP readings For a visual, please reference this diagram: http://ccnc.care/bpdiagram  Provider Name: [facility]     Phone: [phone]  Zone 1: ALL CLEAR  Continue to monitor your symptoms:  BP reading is less than 140 (top number) or less than 90 (bottom number)  No right upper stomach pain No headaches or seeing spots No feeling nauseated or throwing up No swelling in face and hands  Zone 2: CAUTION Call your doctor's office for any of the following:  BP reading is greater than 140 (top number) or greater than 90 (bottom number)  Stomach pain under your ribs in the middle or right side Headaches or seeing spots Feeling nauseated or throwing up Swelling in face and hands  Zone 3: EMERGENCY  Seek immediate medical care if you have any of the following:  BP reading is greater than160 (top number) or greater than 110 (bottom number) Severe headaches not improving with Tylenol Serious difficulty catching your breath Any worsening symptoms from Zone 2   Second Trimester of Pregnancy The second trimester is from week 13 through week 28, months 4 through 6. The second trimester is often a time when you feel your best. Your body has also adjusted to being pregnant, and you begin to feel better physically. Usually, morning sickness has lessened or quit completely, you may have more energy, and you may have an increase in appetite. The second trimester is also a time when the fetus is growing rapidly. At the end of the sixth month, the fetus is about 9 inches long and weighs about 1 pounds. You will likely begin to feel the baby move (quickening) between 18 and 20 weeks of the pregnancy. BODY CHANGES Your body goes through many changes during pregnancy. The changes vary from woman to woman.  Your weight will continue to increase. You will notice your lower abdomen bulging out. You may begin to get stretch marks on your hips, abdomen, and breasts. You may  develop headaches that can be relieved by medicines approved by your health care provider. You may urinate more often because the fetus is pressing on your bladder. You may develop or continue to have heartburn as a result of your pregnancy. You may develop constipation because certain hormones are causing the muscles that push waste through your intestines to slow down. You may develop hemorrhoids or swollen, bulging veins (varicose veins). You may have back pain because of the weight gain and pregnancy hormones relaxing your joints between the bones in your pelvis and as a result of a shift in weight and the muscles that support your balance. Your breasts will continue to grow and be tender. Your gums may bleed and may be sensitive to brushing and flossing. Dark spots or blotches (chloasma, mask of pregnancy) may develop on your face. This will likely fade after the baby is born. A dark line from your belly button to the pubic area (linea nigra) may appear. This will likely fade after the  baby is born. You may have changes in your hair. These can include thickening of your hair, rapid growth, and changes in texture. Some women also have hair loss during or after pregnancy, or hair that feels dry or thin. Your hair will most likely return to normal after your baby is born. WHAT TO EXPECT AT YOUR PRENATAL VISITS During a routine prenatal visit: You will be weighed to make sure you and the fetus are growing normally. Your blood pressure will be taken. Your abdomen will be measured to track your baby's growth. The fetal heartbeat will be listened to. Any test results from the previous visit will be discussed. Your health care provider may ask you: How you are feeling. If you are feeling the baby move. If you have had any abnormal symptoms, such as leaking fluid, bleeding, severe headaches, or abdominal cramping. If you have any questions. Other tests that may be performed during your second  trimester include: Blood tests that check for: Low iron levels (anemia). Gestational diabetes (between 24 and 28 weeks). Rh antibodies. Urine tests to check for infections, diabetes, or protein in the urine. An ultrasound to confirm the proper growth and development of the baby. An amniocentesis to check for possible genetic problems. Fetal screens for spina bifida and Down syndrome. HOME CARE INSTRUCTIONS  Avoid all smoking, herbs, alcohol, and unprescribed drugs. These chemicals affect the formation and growth of the baby. Follow your health care provider's instructions regarding medicine use. There are medicines that are either safe or unsafe to take during pregnancy. Exercise only as directed by your health care provider. Experiencing uterine cramps is a good sign to stop exercising. Continue to eat regular, healthy meals. Wear a good support bra for breast tenderness. Do not use hot tubs, steam rooms, or saunas. Wear your seat belt at all times when driving. Avoid raw meat, uncooked cheese, cat litter boxes, and soil used by cats. These carry germs that can cause birth defects in the baby. Take your prenatal vitamins. Try taking a stool softener (if your health care provider approves) if you develop constipation. Eat more high-fiber foods, such as fresh vegetables or fruit and whole grains. Drink plenty of fluids to keep your urine clear or pale yellow. Take warm sitz baths to soothe any pain or discomfort caused by hemorrhoids. Use hemorrhoid cream if your health care provider approves. If you develop varicose veins, wear support hose. Elevate your feet for 15 minutes, 3-4 times a day. Limit salt in your diet. Avoid heavy lifting, wear low heel shoes, and practice good posture. Rest with your legs elevated if you have leg cramps or low back pain. Visit your dentist if you have not gone yet during your pregnancy. Use a soft toothbrush to brush your teeth and be gentle when you floss. A  sexual relationship may be continued unless your health care provider directs you otherwise. Continue to go to all your prenatal visits as directed by your health care provider. SEEK MEDICAL CARE IF:  You have dizziness. You have mild pelvic cramps, pelvic pressure, or nagging pain in the abdominal area. You have persistent nausea, vomiting, or diarrhea. You have a bad smelling vaginal discharge. You have pain with urination. SEEK IMMEDIATE MEDICAL CARE IF:  You have a fever. You are leaking fluid from your vagina. You have spotting or bleeding from your vagina. You have severe abdominal cramping or pain. You have rapid weight gain or loss. You have shortness of breath with chest pain. You  notice sudden or extreme swelling of your face, hands, ankles, feet, or legs. You have not felt your baby move in over an hour. You have severe headaches that do not go away with medicine. You have vision changes. Document Released: 05/04/2001 Document Revised: 05/15/2013 Document Reviewed: 07/11/2012 [facility] Patient Information 2015 [place], [place]. This information is not intended to replace advice given to you by your health care provider. Make sure you discuss any questions you have with your health care provider.

## 2022-11-24 LAB — VARICELLA ZOSTER ANTIBODY, IGG: Varicella zoster IgG: 1201 index (ref >165)

## 2022-11-30 ENCOUNTER — Telehealth: Payer: Self-pay | Admitting: Women's Health

## 2022-11-30 ENCOUNTER — Encounter: Payer: Self-pay | Admitting: Advanced Practice Midwife

## 2022-11-30 NOTE — Telephone Encounter (Signed)
Returned patient's call.  Stating she needs MMR and Hep B antigen. Informed Hep B and Rubella were both done at new ob appt. Pt stated she tried to submit that however, only her first name was on form.  Informed patient I would print labwork and have at front desk for her to pick up.  Pt verbalized appreciation.

## 2022-11-30 NOTE — Telephone Encounter (Signed)
Patient has left some messages on mychart last week. She would like a call from nurse. Please advise.

## 2022-12-03 ENCOUNTER — Other Ambulatory Visit: Payer: Self-pay | Admitting: *Deleted

## 2022-12-03 DIAGNOSIS — Z789 Other specified health status: Secondary | ICD-10-CM

## 2022-12-07 DIAGNOSIS — Z1159 Encounter for screening for other viral diseases: Secondary | ICD-10-CM | POA: Diagnosis not present

## 2022-12-07 DIAGNOSIS — Z789 Other specified health status: Secondary | ICD-10-CM | POA: Diagnosis not present

## 2022-12-08 LAB — HEPATITIS B SURFACE ANTIBODY,QUALITATIVE: Hep B Surface Ab, Qual: REACTIVE

## 2022-12-08 LAB — MEASLES/MUMPS/RUBELLA IMMUNITY
MUMPS ABS, IGG: 38.2 AU/mL (ref >10.9)
RUBEOLA AB, IGG: 283 AU/mL (ref >16.4)
Rubella Antibodies, IGG: 1.57 index (ref >0.99)

## 2022-12-20 ENCOUNTER — Encounter: Payer: Self-pay | Admitting: Women's Health

## 2022-12-22 ENCOUNTER — Ambulatory Visit (INDEPENDENT_AMBULATORY_CARE_PROVIDER_SITE_OTHER): Payer: 59 | Admitting: Advanced Practice Midwife

## 2022-12-22 ENCOUNTER — Other Ambulatory Visit: Payer: 59

## 2022-12-22 ENCOUNTER — Encounter: Payer: Self-pay | Admitting: Advanced Practice Midwife

## 2022-12-22 VITALS — BP 94/64 | HR 85 | Wt 244.0 lb

## 2022-12-22 DIAGNOSIS — Z3A27 27 weeks gestation of pregnancy: Secondary | ICD-10-CM

## 2022-12-22 DIAGNOSIS — Z3A26 26 weeks gestation of pregnancy: Secondary | ICD-10-CM

## 2022-12-22 DIAGNOSIS — Z3480 Encounter for supervision of other normal pregnancy, unspecified trimester: Secondary | ICD-10-CM

## 2022-12-22 DIAGNOSIS — Z131 Encounter for screening for diabetes mellitus: Secondary | ICD-10-CM | POA: Diagnosis not present

## 2022-12-22 DIAGNOSIS — Z302 Encounter for sterilization: Secondary | ICD-10-CM | POA: Insufficient documentation

## 2022-12-22 NOTE — Patient Instructions (Signed)
Raylea, thank you for choosing our office today! We appreciate the opportunity to meet your healthcare needs. You may receive a short survey by mail, e-mail, or through [employer]. If you are happy with your care we would appreciate if you could take just a few minutes to complete the survey questions. We read all of your comments and take your feedback very seriously. Thank you again for choosing our office.  Center for [employer] Team at [facility]  [facility] at [facility] ([address] [place], [place] [address]) Entrance C, located off of [address] Free 24/7 valet parking   CLASSES: Go to [email or web address] to register for classes (childbirth, breastfeeding, waterbirth, infant CPR, daddy bootcamp, etc.)  Call the office [phone]) or go to [facility] if: You begin to have strong, frequent contractions Your water breaks.  Sometimes it is a big gush of fluid, sometimes it is just a trickle that keeps getting your panties wet or running down your legs You have vaginal bleeding.  It is normal to have a small amount of spotting if your cervix was checked.  You don't feel your baby moving like normal.  If you don't, get you something to eat and drink and lay down and focus on feeling your baby move.   If your baby is still not moving like normal, you should call the office or go to [facility].  Call the office [phone]) or go to [facility] hospital for these signs of pre-eclampsia: Severe headache that does not go away with Tylenol Visual changes- seeing spots, double, blurred vision Pain under your right breast or upper abdomen that does not go away with Tums or heartburn medicine Nausea and/or vomiting Severe swelling in your hands, feet, and face   Tdap Vaccine It is recommended that you get the Tdap vaccine during the third trimester of EACH pregnancy to help protect your baby from getting pertussis (whooping cough) 27-36 weeks is the BEST time to do  this so that you can pass the protection on to your baby. During pregnancy is better than after pregnancy, but if you are unable to get it during pregnancy it will be offered at the hospital.  You can get this vaccine with [place], at the health department, your family doctor, or some local pharmacies Everyone who will be around your baby should also be up-to-date on their vaccines before the baby comes. Adults (who are not pregnant) only need 1 dose of Tdap during adulthood.   [facility] Pediatricians/Family Doctors [facility]): [address] [place]lette Ribas, [phone]           [facility]: [address], [phone]                [facility] Medicine [facility]): [address], [phone] (call to ask if accepting patients) [facility] Department: [address], [place], [phone]    [facility] Pediatricians/Family Doctors [facility]): [phone], [phone] [facility]: [address] [place], [phone] [facility]: [address], [phone]  [facility] Doctors  [facility]): [phone] [facility]: [address] [phone]   [facility] Doctors [facility]: [address], [phone]  [facility] Family Doctors  [facility]: [phone], [phone]  Home Blood Pressure Monitoring for Patients   Your provider has recommended that you check your  blood pressure (BP) at least once a week at home. If you do not have a blood pressure cuff at home, one will be provided for you. Contact your provider if you have not received your monitor within 1 week.   Helpful Tips for Accurate Home Blood Pressure Checks  Don't smoke, exercise, or drink caffeine 30 minutes before checking your BP Use the restroom before checking your BP (a full bladder can raise your  pressure) Relax in a comfortable upright chair Feet on the ground Left arm resting comfortably on a flat surface at the level of your heart Legs uncrossed Back supported Sit quietly and don't talk Place the cuff on your bare arm Adjust snuggly, so that only two fingertips can fit between your skin and the top of the cuff Check 2 readings separated by at least one minute Keep a log of your BP readings For a visual, please reference this diagram: http://ccnc.care/bpdiagram  Provider Name: [facility]     Phone: [phone]  Zone 1: ALL CLEAR  Continue to monitor your symptoms:  BP reading is less than 140 (top number) or less than 90 (bottom number)  No right upper stomach pain No headaches or seeing spots No feeling nauseated or throwing up No swelling in face and hands  Zone 2: CAUTION Call your doctor's office for any of the following:  BP reading is greater than 140 (top number) or greater than 90 (bottom number)  Stomach pain under your ribs in the middle or right side Headaches or seeing spots Feeling nauseated or throwing up Swelling in face and hands  Zone 3: EMERGENCY  Seek immediate medical care if you have any of the following:  BP reading is greater than160 (top number) or greater than 110 (bottom number) Severe headaches not improving with Tylenol Serious difficulty catching your breath Any worsening symptoms from Zone 2   Third Trimester of Pregnancy The third trimester is from week 29 through week 42, months 7 through 9. The third trimester is a time when the fetus is growing rapidly. At the end of the ninth month, the fetus is about 20 inches in length and weighs 6-10 pounds.  BODY CHANGES Your body goes through many changes during pregnancy. The changes vary from woman to woman.  Your weight will continue to increase. You can expect to gain 25-35 pounds (11-16 kg) by the end of the pregnancy. You may begin to get stretch marks on your hips, abdomen,  and breasts. You may urinate more often because the fetus is moving lower into your pelvis and pressing on your bladder. You may develop or continue to have heartburn as a result of your pregnancy. You may develop constipation because certain hormones are causing the muscles that push waste through your intestines to slow down. You may develop hemorrhoids or swollen, bulging veins (varicose veins). You may have pelvic pain because of the weight gain and pregnancy hormones relaxing your joints between the bones in your pelvis. Backaches may result from overexertion of the muscles supporting your posture. You may have changes in your hair. These can include thickening of your hair, rapid growth, and changes in texture. Some women also have hair loss during or after pregnancy, or hair that feels dry or thin. Your hair will most likely return to normal after your baby is born. Your breasts will continue to grow and be tender. A yellow discharge may leak from your breasts called colostrum. Your belly button may stick out. You may  feel short of breath because of your expanding uterus. You may notice the fetus "dropping," or moving lower in your abdomen. You may have a bloody mucus discharge. This usually occurs a few days to a week before labor begins. Your cervix becomes thin and soft (effaced) near your due date. WHAT TO EXPECT AT YOUR PRENATAL EXAMS  You will have prenatal exams every 2 weeks until week 36. Then, you will have weekly prenatal exams. During a routine prenatal visit: You will be weighed to make sure you and the fetus are growing normally. Your blood pressure is taken. Your abdomen will be measured to track your baby's growth. The fetal heartbeat will be listened to. Any test results from the previous visit will be discussed. You may have a cervical check near your due date to see if you have effaced. At around 36 weeks, your caregiver will check your cervix. At the same time, your  caregiver will also perform a test on the secretions of the vaginal tissue. This test is to determine if a type of bacteria, Group B streptococcus, is present. Your caregiver will explain this further. Your caregiver may ask you: What your birth plan is. How you are feeling. If you are feeling the baby move. If you have had any abnormal symptoms, such as leaking fluid, bleeding, severe headaches, or abdominal cramping. If you have any questions. Other tests or screenings that may be performed during your third trimester include: Blood tests that check for low iron levels (anemia). Fetal testing to check the health, activity level, and growth of the fetus. Testing is done if you have certain medical conditions or if there are problems during the pregnancy. FALSE LABOR You may feel small, irregular contractions that eventually go away. These are called Braxton Hicks contractions, or false labor. Contractions may last for hours, days, or even weeks before true labor sets in. If contractions come at regular intervals, intensify, or become painful, it is best to be seen by your caregiver.  SIGNS OF LABOR  Menstrual-like cramps. Contractions that are 5 minutes apart or less. Contractions that start on the top of the uterus and spread down to the lower abdomen and back. A sense of increased pelvic pressure or back pain. A watery or bloody mucus discharge that comes from the vagina. If you have any of these signs before the 37th week of pregnancy, call your caregiver right away. You need to go to the hospital to get checked immediately. HOME CARE INSTRUCTIONS  Avoid all smoking, herbs, alcohol, and unprescribed drugs. These chemicals affect the formation and growth of the baby. Follow your caregiver's instructions regarding medicine use. There are medicines that are either safe or unsafe to take during pregnancy. Exercise only as directed by your caregiver. Experiencing uterine cramps is a good sign to  stop exercising. Continue to eat regular, healthy meals. Wear a good support bra for breast tenderness. Do not use hot tubs, steam rooms, or saunas. Wear your seat belt at all times when driving. Avoid raw meat, uncooked cheese, cat litter boxes, and soil used by cats. These carry germs that can cause birth defects in the baby. Take your prenatal vitamins. Try taking a stool softener (if your caregiver approves) if you develop constipation. Eat more high-fiber foods, such as fresh vegetables or fruit and whole grains. Drink plenty of fluids to keep your urine clear or pale yellow. Take warm sitz baths to soothe any pain or discomfort caused by hemorrhoids. Use hemorrhoid cream if  your caregiver approves. If you develop varicose veins, wear support hose. Elevate your feet for 15 minutes, 3-4 times a day. Limit salt in your diet. Avoid heavy lifting, wear low heal shoes, and practice good posture. Rest a lot with your legs elevated if you have leg cramps or low back pain. Visit your dentist if you have not gone during your pregnancy. Use a soft toothbrush to brush your teeth and be gentle when you floss. A sexual relationship may be continued unless your caregiver directs you otherwise. Do not travel far distances unless it is absolutely necessary and only with the approval of your caregiver. Take prenatal classes to understand, practice, and ask questions about the labor and delivery. Make a trial run to the hospital. Pack your hospital bag. Prepare the baby's nursery. Continue to go to all your prenatal visits as directed by your caregiver. SEEK MEDICAL CARE IF: You are unsure if you are in labor or if your water has broken. You have dizziness. You have mild pelvic cramps, pelvic pressure, or nagging pain in your abdominal area. You have persistent nausea, vomiting, or diarrhea. You have a bad smelling vaginal discharge. You have pain with urination. SEEK IMMEDIATE MEDICAL CARE IF:  You  have a fever. You are leaking fluid from your vagina. You have spotting or bleeding from your vagina. You have severe abdominal cramping or pain. You have rapid weight loss or gain. You have shortness of breath with chest pain. You notice sudden or extreme swelling of your face, hands, ankles, feet, or legs. You have not felt your baby move in over an hour. You have severe headaches that do not go away with medicine. You have vision changes. Document Released: 05/04/2001 Document Revised: 05/15/2013 Document Reviewed: 07/11/2012 [facility] Patient Information 2015 [place], [place]. This information is not intended to replace advice given to you by your health care provider. Make sure you discuss any questions you have with your health care provider.

## 2022-12-22 NOTE — Progress Notes (Signed)
   LOW-RISK PREGNANCY VISIT Patient name: Sharon Lutz MRN [id number]  Date of birth: Apr 24, 1986 Chief Complaint:   Routine Prenatal Visit (PN2)  History of Present Illness:   Sharon Lutz is a 37 y.o. [id number] female at [redacted]w[redacted]d with an Estimated Date of Delivery: 03/23/23 being seen today for ongoing management of a low-risk pregnancy.  Today she reports  feeling well . Contractions: Not present.  .  Movement: Present. denies leaking of fluid. Review of Systems:   Pertinent items are noted in HPI Denies abnormal vaginal discharge w/ itching/odor/irritation, headaches, visual changes, shortness of breath, chest pain, abdominal pain, severe nausea/vomiting, or problems with urination or bowel movements unless otherwise stated above. Pertinent History Reviewed:  Reviewed past medical,surgical, social, obstetrical and family history.  Reviewed problem list, medications and allergies. Physical Assessment:   Vitals:   12/22/22 0856  BP: 94/64  Pulse: 85  Weight: 244 lb (110.7 kg)  Body mass index is 36.03 kg/m.        Physical Examination:   General appearance: Well appearing, and in no distress  Mental status: Alert, oriented to person, place, and time  Skin: Warm & dry  Cardiovascular: Normal heart rate noted  Respiratory: Normal respiratory effort, no distress  Abdomen: Soft, gravid, nontender  Pelvic: Cervical exam deferred         Extremities: Edema: None  Fetal Status: Fetal Heart Rate (bpm): 147 Fundal Height: 27 cm Movement: Present    No results found for this or any previous visit (from the past 24 hour(s)).  Assessment & Plan:  1) Low-risk pregnancy [id number] at [redacted]w[redacted]d with an Estimated Date of Delivery: 03/23/23   2) BMI 36, EFW 32wks; IOL 39-40wks  3) Desires ppBTL, 30d papers today   Meds: No orders of the defined types were placed in this encounter.  Labs/procedures today: PN2  Plan:  Continue routine obstetrical care; Tdap next visit  Reviewed:  Preterm labor symptoms and general obstetric precautions including but not limited to vaginal bleeding, contractions, leaking of fluid and fetal movement were reviewed in detail with the patient.  All questions were answered. Has home bp cuff. Check bp weekly, let us know if >140/90.   Follow-up: Return in about 3 weeks (around 01/12/2023) for LROB, Sign BTL consent today.  No orders of the defined types were placed in this encounter.  Arabella Merles CNM 12/22/2022 9:21 AM

## 2022-12-23 ENCOUNTER — Encounter: Payer: Self-pay | Admitting: Advanced Practice Midwife

## 2022-12-23 DIAGNOSIS — D696 Thrombocytopenia, unspecified: Secondary | ICD-10-CM | POA: Insufficient documentation

## 2022-12-30 ENCOUNTER — Encounter: Payer: Self-pay | Admitting: Women's Health

## 2023-01-12 ENCOUNTER — Encounter: Payer: Self-pay | Admitting: Women's Health

## 2023-01-12 ENCOUNTER — Ambulatory Visit (INDEPENDENT_AMBULATORY_CARE_PROVIDER_SITE_OTHER): Payer: 59 | Admitting: Women's Health

## 2023-01-12 VITALS — BP 105/67 | HR 88 | Wt 241.0 lb

## 2023-01-12 DIAGNOSIS — O99113 Other diseases of the blood and blood-forming organs and certain disorders involving the immune mechanism complicating pregnancy, third trimester: Secondary | ICD-10-CM

## 2023-01-12 DIAGNOSIS — Z3A30 30 weeks gestation of pregnancy: Secondary | ICD-10-CM

## 2023-01-12 DIAGNOSIS — Z3483 Encounter for supervision of other normal pregnancy, third trimester: Secondary | ICD-10-CM

## 2023-01-12 DIAGNOSIS — D696 Thrombocytopenia, unspecified: Secondary | ICD-10-CM

## 2023-01-12 DIAGNOSIS — Z3480 Encounter for supervision of other normal pregnancy, unspecified trimester: Secondary | ICD-10-CM

## 2023-01-12 NOTE — Patient Instructions (Signed)
Kalena, thank you for choosing our office today! We appreciate the opportunity to meet your healthcare needs. You may receive a short survey by mail, e-mail, or through [employer]. If you are happy with your care we would appreciate if you could take just a few minutes to complete the survey questions. We read all of your comments and take your feedback very seriously. Thank you again for choosing our office.  Center for [employer] Team at [facility]  [facility] at [facility] ([address] [place], [place] [address]) Entrance C, located off of [address] Free 24/7 valet parking   CLASSES: Go to [email or web address] to register for classes (childbirth, breastfeeding, waterbirth, infant CPR, daddy bootcamp, etc.)  Call the office [phone]) or go to [facility] if: You begin to have strong, frequent contractions Your water breaks.  Sometimes it is a big gush of fluid, sometimes it is just a trickle that keeps getting your panties wet or running down your legs You have vaginal bleeding.  It is normal to have a small amount of spotting if your cervix was checked.  You don't feel your baby moving like normal.  If you don't, get you something to eat and drink and lay down and focus on feeling your baby move.   If your baby is still not moving like normal, you should call the office or go to [facility].  Call the office [phone]) or go to [facility] hospital for these signs of pre-eclampsia: Severe headache that does not go away with Tylenol Visual changes- seeing spots, double, blurred vision Pain under your right breast or upper abdomen that does not go away with Tums or heartburn medicine Nausea and/or vomiting Severe swelling in your hands, feet, and face   Tdap Vaccine It is recommended that you get the Tdap vaccine during the third trimester of EACH pregnancy to help protect your baby from getting pertussis (whooping cough) 27-36 weeks is the BEST time to do  this so that you can pass the protection on to your baby. During pregnancy is better than after pregnancy, but if you are unable to get it during pregnancy it will be offered at the hospital.  You can get this vaccine with [place], at the health department, your family doctor, or some local pharmacies Everyone who will be around your baby should also be up-to-date on their vaccines before the baby comes. Adults (who are not pregnant) only need 1 dose of Tdap during adulthood.   [facility] Pediatricians/Family Doctors [facility]): [address] [place], [phone]           [facility]: [address], [phone]                [facility]): [address], [phone] (call to ask if accepting patients) [facility] Department: [address], [place], [phone]    [facility] Pediatricians/Family Doctors [facility]): [phone] [address], [phone] [facility]: [address] [place], [phone] [facility]: [address], [phone]  [facility] Doctors  [facility]): [phone] [facility]: [address] [phone]   [facility] Doctors [facility]: [address] [phone]  [facility] Family Doctors  [facility]: [phone], [phone]  Home Blood Pressure Monitoring for Patients   Your provider has recommended that you check your  blood pressure (BP) at least once a week at home. If you do not have a blood pressure cuff at home, one will be provided for you. Contact your provider if you have not received your monitor within 1 week.   Helpful Tips for Accurate Home Blood Pressure Checks  Don't smoke, exercise, or drink caffeine 30 minutes before checking your BP Use the restroom before checking your BP (a full bladder can raise your  pressure) Relax in a comfortable upright chair Feet on the ground Left arm resting comfortably on a flat surface at the level of your heart Legs uncrossed Back supported Sit quietly and don't talk Place the cuff on your bare arm Adjust snuggly, so that only two fingertips can fit between your skin and the top of the cuff Check 2 readings separated by at least one minute Keep a log of your BP readings For a visual, please reference this diagram: http://ccnc.care/bpdiagram  Provider Name: [facility]     Phone: [phone]  Zone 1: ALL CLEAR  Continue to monitor your symptoms:  BP reading is less than 140 (top number) or less than 90 (bottom number)  No right upper stomach pain No headaches or seeing spots No feeling nauseated or throwing up No swelling in face and hands  Zone 2: CAUTION Call your doctor's office for any of the following:  BP reading is greater than 140 (top number) or greater than 90 (bottom number)  Stomach pain under your ribs in the middle or right side Headaches or seeing spots Feeling nauseated or throwing up Swelling in face and hands  Zone 3: EMERGENCY  Seek immediate medical care if you have any of the following:  BP reading is greater than160 (top number) or greater than 110 (bottom number) Severe headaches not improving with Tylenol Serious difficulty catching your breath Any worsening symptoms from Zone 2  Preterm Labor and Birth Information  The normal length of a pregnancy is 39-41 weeks. Preterm labor is when labor starts before 37 completed weeks of pregnancy. What are the risk factors for preterm labor? Preterm labor is more likely to occur in women who: Have certain infections during pregnancy such as a bladder infection, sexually transmitted infection, or infection inside the uterus (chorioamnionitis). Have a shorter-than-normal cervix. Have gone into preterm labor before. Have had surgery on their cervix. Are younger than age 108  or older than age 67. Are African American. Are pregnant with twins or multiple babies (multiple gestation). Take street drugs or smoke while pregnant. Do not gain enough weight while pregnant. Became pregnant shortly after having been pregnant. What are the symptoms of preterm labor? Symptoms of preterm labor include: Cramps similar to those that can happen during a menstrual period. The cramps may happen with diarrhea. Pain in the abdomen or lower back. Regular uterine contractions that may feel like tightening of the abdomen. A feeling of increased pressure in the pelvis. Increased watery or bloody mucus discharge from the vagina. Water breaking (ruptured amniotic sac). Why is it important to recognize signs of preterm labor? It is important to recognize signs of preterm labor because babies who are born prematurely may not be fully developed. This can put them at an increased risk for: Long-term (chronic) heart and lung problems. Difficulty immediately after birth with regulating body systems, including blood sugar, body temperature, heart rate, and breathing rate. Bleeding in the brain. Cerebral palsy. Learning difficulties. Death. These risks are highest for babies who are born before 34 weeks  of pregnancy. How is preterm labor treated? Treatment depends on the length of your pregnancy, your condition, and the health of your baby. It may involve: Having a stitch (suture) placed in your cervix to prevent your cervix from opening too early (cerclage). Taking or being given medicines, such as: Hormone medicines. These may be given early in pregnancy to help support the pregnancy. Medicine to stop contractions. Medicines to help mature the baby's lungs. These may be prescribed if the risk of delivery is high. Medicines to prevent your baby from developing cerebral palsy. If the labor happens before 34 weeks of pregnancy, you may need to stay in the hospital. What should I do if I  think I am in preterm labor? If you think that you are going into preterm labor, call your health care provider right away. How can I prevent preterm labor in future pregnancies? To increase your chance of having a full-term pregnancy: Do not use any tobacco products, such as cigarettes, chewing tobacco, and e-cigarettes. If you need help quitting, ask your health care provider. Do not use street drugs or medicines that have not been prescribed to you during your pregnancy. Talk with your health care provider before taking any herbal supplements, even if you have been taking them regularly. Make sure you gain a healthy amount of weight during your pregnancy. Watch for infection. If you think that you might have an infection, get it checked right away. Make sure to tell your health care provider if you have gone into preterm labor before. This information is not intended to replace advice given to you by your health care provider. Make sure you discuss any questions you have with your health care provider. Document Revised: 09/01/2018 Document Reviewed: 10/01/2015 Elsevier Patient Education  2020 [employer].

## 2023-01-12 NOTE — Progress Notes (Signed)
    LOW-RISK PREGNANCY VISIT Patient name: Sharon Lutz MRN [id number]  Date of birth: 1986/03/21 Chief Complaint:   Routine Prenatal Visit  History of Present Illness:   CYNITHA OBERHOLTZER is a 37 y.o. [id number] female at [redacted]w[redacted]d with an Estimated Date of Delivery: 03/23/23 being seen today for ongoing management of a low-risk pregnancy.   Today she reports occasional contractions. Contractions: Not present. Vag. Bleeding: None.  Movement: Present. denies leaking of fluid.     09/16/2022    9:04 AM 07/12/2022    1:41 PM  Depression screen PHQ 2/9  Decreased Interest 1 1  Down, Depressed, Hopeless 0 0  PHQ - 2 Score 1 1  Altered sleeping 0 0  Tired, decreased energy 1 2  Change in appetite 1 2  Feeling bad or failure about yourself  0 0  Trouble concentrating 0 1  Moving slowly or fidgety/restless 0 0  Suicidal thoughts 0 0  PHQ-9 Score 3 6        07/12/2022    1:42 PM  GAD 7 : Generalized Anxiety Score  Nervous, Anxious, on Edge 0  Control/stop worrying 0  Worry too much - different things 0  Trouble relaxing 0  Restless 0  Easily annoyed or irritable 0  Afraid - awful might happen 0  Total GAD 7 Score 0      Review of Systems:   Pertinent items are noted in HPI Denies abnormal vaginal discharge w/ itching/odor/irritation, headaches, visual changes, shortness of breath, chest pain, abdominal pain, severe nausea/vomiting, or problems with urination or bowel movements unless otherwise stated above. Pertinent History Reviewed:  Reviewed past medical,surgical, social, obstetrical and family history.  Reviewed problem list, medications and allergies. Physical Assessment:   Vitals:   01/12/23 0915  BP: 105/67  Pulse: 88  Weight: 241 lb (109.3 kg)  Body mass index is 35.59 kg/m.        Physical Examination:   General appearance: Well appearing, and in no distress  Mental status: Alert, oriented to person, place, and time  Skin: Warm & dry  Cardiovascular:  Normal heart rate noted  Respiratory: Normal respiratory effort, no distress  Abdomen: Soft, gravid, nontender  Pelvic: Cervical exam deferred         Extremities: Edema: None  Fetal Status: Fetal Heart Rate (bpm): 138 Fundal Height: 30 cm Movement: Present    Chaperone: N/A   No results found for this or any previous visit (from the past 24 hour(s)).  Assessment & Plan:  1) Low-risk pregnancy [id number] at [redacted]w[redacted]d with an Estimated Date of Delivery: 03/23/23   2) Gestational thrombocytopenia, last plt 128, plan repeat 36w   Meds: No orders of the defined types were placed in this encounter.  Labs/procedures today: none  Plan:  Continue routine obstetrical care  Next visit: prefers online    Reviewed: Preterm labor symptoms and general obstetric precautions including but not limited to vaginal bleeding, contractions, leaking of fluid and fetal movement were reviewed in detail with the patient.  All questions were answered. Does have home bp cuff. Office bp cuff given: not applicable. Check bp weekly, let us know if consistently >140 and/or >90.  Follow-up: Return in about 2 weeks (around 01/26/2023) for LROB, CNM online; schedule EFW u/s @ 36wk please.  No future appointments.  No orders of the defined types were placed in this encounter.  Cheral Marker CNM, [facility] 01/12/2023 9:23 AM

## 2023-01-26 ENCOUNTER — Encounter: Payer: Self-pay | Admitting: Obstetrics and Gynecology

## 2023-01-26 ENCOUNTER — Telehealth (INDEPENDENT_AMBULATORY_CARE_PROVIDER_SITE_OTHER): Payer: 59 | Admitting: Obstetrics and Gynecology

## 2023-01-26 VITALS — BP 120/72

## 2023-01-26 DIAGNOSIS — E669 Obesity, unspecified: Secondary | ICD-10-CM

## 2023-01-26 DIAGNOSIS — R12 Heartburn: Secondary | ICD-10-CM

## 2023-01-26 DIAGNOSIS — Z3480 Encounter for supervision of other normal pregnancy, unspecified trimester: Secondary | ICD-10-CM

## 2023-01-26 DIAGNOSIS — O99113 Other diseases of the blood and blood-forming organs and certain disorders involving the immune mechanism complicating pregnancy, third trimester: Secondary | ICD-10-CM

## 2023-01-26 DIAGNOSIS — Z3A32 32 weeks gestation of pregnancy: Secondary | ICD-10-CM

## 2023-01-26 DIAGNOSIS — D696 Thrombocytopenia, unspecified: Secondary | ICD-10-CM

## 2023-01-26 DIAGNOSIS — O26893 Other specified pregnancy related conditions, third trimester: Secondary | ICD-10-CM

## 2023-01-26 DIAGNOSIS — Z302 Encounter for sterilization: Secondary | ICD-10-CM

## 2023-01-26 MED ORDER — FAMOTIDINE 20 MG PO TABS: 20.0000 mg | ORAL_TABLET | Freq: Every day | ORAL | 0 refills | Status: DC

## 2023-01-26 NOTE — Progress Notes (Signed)
  I connected with Caleen Essex 07/19/22 at  1:30 PM EST by: MyChart video and verified that I am speaking with the correct person using two identifiers.  Patient is located at home and provider is located at [facility].     I discussed the limitations, risks, security and privacy concerns of performing an evaluation and management service by MyChart video and the availability of in person appointments. I also discussed with the patient that there may be a patient responsible charge related to this service. By engaging in this virtual visit, you consent to the provision of healthcare.  Additionally, you authorize for your insurance to be billed for the services provided during this visit.  The patient expressed understanding and agreed to proceed.   The following staff members participated in the virtual visit:         PRENATAL VISIT NOTE  Subjective:  Sharon Lutz is a 37 y.o. [id number] at [redacted]w[redacted]d being seen today for ongoing prenatal care.  She is currently monitored for the following issues for this low-risk pregnancy and has Anemia; Encounter for supervision of normal pregnancy, antepartum; Obesity (BMI 35.0-39.9 without comorbidity); Alpha thalassemia silent carrier; Request for sterilization; and Gestational thrombocytopenia (HCC) on their problem list.  Patient reports heartburn, tums not helping  Denies leaking of fluid, vaginal bleeding, contractions. Movement : present   The following portions of the patient's history were reviewed and updated as appropriate: allergies, current medications, past family history, past medical history, past social history, past surgical history and problem list.   Objective:   Vitals:   01/26/23 1058  BP: 120/72    Fetal Status:     Movement: Present     General:  Alert, oriented and cooperative. Patient is in no acute distress.  Skin: Skin is warm and dry. No rash noted.   Cardiovascular: Normal heart rate noted  Respiratory: Normal  respiratory effort, no problems with respiration noted  Abdomen: Soft, gravid, appropriate for gestational age.  Pain/Pressure: Present     Pelvic: Cervical exam deferred        Extremities: Normal range of motion.  Edema: None  Mental Status: Normal mood and affect. Normal behavior. Normal judgment and thought content.   Assessment and Plan:  Pregnancy: [id number] at [redacted]w[redacted]d  1. Heartburn during pregnancy in third trimester Trial of pepcid - famotidine (PEPCID) 20 MG tablet; Take 1 tablet (20 mg total) by mouth at bedtime. (Patient not taking: Reported on 01/26/2023)  Dispense: 30 tablet; Refill: 0  2. Supervision of other normal pregnancy, antepartum BP normal at home Feeling regular fetal movement Plan on repeating u/s 36 wks to assess EFW    3. Request for sterilization Discussed and signed BTL 12/22/22, reiterated signing does not mean permanent, will be consented at the hospital as well   4. Benign gestational thrombocytopenia in third trimester (HCC) Repeat labs 36 wk  6. [redacted] weeks gestation of pregnancy Anticipatory guidance regarding upcoming appts   Preterm labor symptoms and general obstetric precautions including but not limited to vaginal bleeding, contractions, leaking of fluid and fetal movement were reviewed in detail with the patient. Please refer to After Visit Summary for other counseling recommendations.   Return in two weeks for routine prenatal   Future Appointments  Date Time Provider Department Center  02/23/2023 10:00 AM [facility] [place] [facility] None  02/23/2023 10:50 AM Myna Hidalgo, DO [facility]     Albertine Grates, FNP

## 2023-01-28 ENCOUNTER — Encounter: Payer: Self-pay | Admitting: Women's Health

## 2023-02-09 ENCOUNTER — Encounter: Payer: Self-pay | Admitting: Women's Health

## 2023-02-09 ENCOUNTER — Ambulatory Visit (INDEPENDENT_AMBULATORY_CARE_PROVIDER_SITE_OTHER): Payer: Medicaid Other | Admitting: Women's Health

## 2023-02-09 VITALS — BP 106/69 | HR 97 | Wt 248.6 lb

## 2023-02-09 DIAGNOSIS — Z3480 Encounter for supervision of other normal pregnancy, unspecified trimester: Secondary | ICD-10-CM

## 2023-02-09 DIAGNOSIS — Z3A34 34 weeks gestation of pregnancy: Secondary | ICD-10-CM

## 2023-02-09 DIAGNOSIS — O3663X0 Maternal care for excessive fetal growth, third trimester, not applicable or unspecified: Secondary | ICD-10-CM

## 2023-02-09 DIAGNOSIS — Z3483 Encounter for supervision of other normal pregnancy, third trimester: Secondary | ICD-10-CM

## 2023-02-09 NOTE — Progress Notes (Signed)
LOW-RISK PREGNANCY VISIT Patient name: Sharon Lutz MRN [id number]  Date of birth: 03/15/1986 Chief Complaint:   Routine Prenatal Visit  History of Present Illness:   DANNISHA THREADGILL is a 37 y.o. [id number] female at [redacted]w[redacted]d with an Estimated Date of Delivery: 03/23/23 being seen today for ongoing management of a low-risk pregnancy.   Today she reports pelvic pressure, belt/tape not helping-discussed. Contractions: Not present.  .  Movement: Present. denies leaking of fluid.     09/16/2022    9:04 AM 07/12/2022    1:41 PM  Depression screen PHQ 2/9  Decreased Interest 1 1  Down, Depressed, Hopeless 0 0  PHQ - 2 Score 1 1  Altered sleeping 0 0  Tired, decreased energy 1 2  Change in appetite 1 2  Feeling bad or failure about yourself  0 0  Trouble concentrating 0 1  Moving slowly or fidgety/restless 0 0  Suicidal thoughts 0 0  PHQ-9 Score 3 6        07/12/2022    1:42 PM  GAD 7 : Generalized Anxiety Score  Nervous, Anxious, on Edge 0  Control/stop worrying 0  Worry too much - different things 0  Trouble relaxing 0  Restless 0  Easily annoyed or irritable 0  Afraid - awful might happen 0  Total GAD 7 Score 0      Review of Systems:   Pertinent items are noted in HPI Denies abnormal vaginal discharge w/ itching/odor/irritation, headaches, visual changes, shortness of breath, chest pain, abdominal pain, severe nausea/vomiting, or problems with urination or bowel movements unless otherwise stated above. Pertinent History Reviewed:  Reviewed past medical,surgical, social, obstetrical and family history.  Reviewed problem list, medications and allergies. Physical Assessment:   Vitals:   02/09/23 1016  BP: 106/69  Pulse: 97  Weight: 248 lb 9.6 oz (112.8 kg)  Body mass index is 36.71 kg/m.        Physical Examination:   General appearance: Well appearing, and in no distress  Mental status: Alert, oriented to person, place, and time  Skin: Warm &  dry  Cardiovascular: Normal heart rate noted  Respiratory: Normal respiratory effort, no distress  Abdomen: Soft, gravid, nontender  Pelvic: Cervical exam deferred         Extremities: Edema: None  Fetal Status: Fetal Heart Rate (bpm): 152 Fundal Height: 34 cm Movement: Present    Chaperone: N/A   No results found for this or any previous visit (from the past 24 hour(s)).  Assessment & Plan:  1) Low-risk pregnancy [id number] at [redacted]w[redacted]d with an Estimated Date of Delivery: 03/23/23   2) EFW 96% @ 18wks, rpt EFW next visit  3) Gestational thrombocytopenia> 128 last check, repeat next visit   Meds: No orders of the defined types were placed in this encounter.  Labs/procedures today: none  Plan:  Continue routine obstetrical care  Next visit: prefers will be in person for u/s and cultures     Reviewed: Preterm labor symptoms and general obstetric precautions including but not limited to vaginal bleeding, contractions, leaking of fluid and fetal movement were reviewed in detail with the patient.  All questions were answered. Does have home bp cuff. Office bp cuff given: not applicable. Check bp weekly, let us know if consistently >140 and/or >90.  Follow-up: Return for As scheduled.  Future Appointments  Date Time Provider Department Center  02/23/2023 10:00 AM [facility] [place] [facility] None  02/23/2023 10:50 AM Myna Hidalgo, DO [facility]    Orders Placed This Encounter  Procedures   US OB Follow Up   Cheral Marker CNM, [facility] 02/09/2023 10:28 AM

## 2023-02-09 NOTE — Patient Instructions (Signed)
Sharon Lutz, thank you for choosing our office today! We appreciate the opportunity to meet your healthcare needs. You may receive a short survey by mail, e-mail, or through [employer]. If you are happy with your care we would appreciate if you could take just a few minutes to complete the survey questions. We read all of your comments and take your feedback very seriously. Thank you again for choosing our office.  Center for [employer] Team at [facility]  [facility] ([address] [place], [place] [address]) Entrance C, located off of [address] Free 24/7 valet parking   CLASSES: Go to [email or web address] to register for classes (childbirth, breastfeeding, waterbirth, infant CPR, daddy bootcamp, etc.)  Call the office [phone]) or go to [facility] if: You begin to have strong, frequent contractions Your water breaks.  Sometimes it is a big gush of fluid, sometimes it is just a trickle that keeps getting your panties wet or running down your legs You have vaginal bleeding.  It is normal to have a small amount of spotting if your cervix was checked.  You don't feel your baby moving like normal.  If you don't, get you something to eat and drink and lay down and focus on feeling your baby move.   If your baby is still not moving like normal, you should call the office or go to [facility].  Call the office [phone]) or go to [facility] hospital for these signs of pre-eclampsia: Severe headache that does not go away with Tylenol Visual changes- seeing spots, double, blurred vision Pain under your right breast or upper abdomen that does not go away with Tums or heartburn medicine Nausea and/or vomiting Severe swelling in your hands, feet, and face   Tdap Vaccine It is recommended that you get the Tdap vaccine during the third trimester of EACH pregnancy to help protect your baby from getting pertussis (whooping cough) 27-36 weeks is the BEST time to do  this so that you can pass the protection on to your baby. During pregnancy is better than after pregnancy, but if you are unable to get it during pregnancy it will be offered at the hospital.  You can get this vaccine with [place], at the health department, your family doctor, or some local pharmacies Everyone who will be around your baby should also be up-to-date on their vaccines before the baby comes. Adults (who are not pregnant) only need 1 dose of Tdap during adulthood.   [facility] Pediatricians/Family Doctors [facility]): [address] Colette Ribas, [phone]           [facility]: [address], [phone]                [facility] Medicine [facility]): [address], [phone] (call to ask if accepting patients) [facility] Department: [address], [place], [phone]    [facility] Pediatricians/Family Doctors [facility]): [phone] [address], [phone] [facility]: [address] [place], [phone] [facility]: [address], [phone]  [facility] Doctors  [facility]): [phone] [facility]: [address] [phone]   [facility] Doctors [facility]: [address], [phone]  [facility] Family Doctors  [facility]: [phone], [phone]  Home Blood Pressure Monitoring for Patients   Your provider has recommended that you check your  blood pressure (BP) at least once a week at home. If you do not have a blood pressure cuff at home, one will be provided for you. Contact your provider if you have not received your monitor within 1 week.   Helpful Tips for Accurate Home Blood Pressure Checks  Don't smoke, exercise, or drink caffeine 30 minutes before checking your BP Use the restroom before checking your BP (a full bladder can raise your  pressure) Relax in a comfortable upright chair Feet on the ground Left arm resting comfortably on a flat surface at the level of your heart Legs uncrossed Back supported Sit quietly and don't talk Place the cuff on your bare arm Adjust snuggly, so that only two fingertips can fit between your skin and the top of the cuff Check 2 readings separated by at least one minute Keep a log of your BP readings For a visual, please reference this diagram: http://ccnc.care/bpdiagram  Provider Name: [facility]     Phone: [phone]  Zone 1: ALL CLEAR  Continue to monitor your symptoms:  BP reading is less than 140 (top number) or less than 90 (bottom number)  No right upper stomach pain No headaches or seeing spots No feeling nauseated or throwing up No swelling in face and hands  Zone 2: CAUTION Call your doctor's office for any of the following:  BP reading is greater than 140 (top number) or greater than 90 (bottom number)  Stomach pain under your ribs in the middle or right side Headaches or seeing spots Feeling nauseated or throwing up Swelling in face and hands  Zone 3: EMERGENCY  Seek immediate medical care if you have any of the following:  BP reading is greater than160 (top number) or greater than 110 (bottom number) Severe headaches not improving with Tylenol Serious difficulty catching your breath Any worsening symptoms from Zone 2  Preterm Labor and Birth Information  The normal length of a pregnancy is 39-41 weeks. Preterm labor is when labor starts before 37 completed weeks of pregnancy. What are the risk factors for preterm labor? Preterm labor is more likely to occur in women who: Have certain infections during pregnancy such as a bladder infection, sexually transmitted infection, or infection inside the uterus (chorioamnionitis). Have a shorter-than-normal cervix. Have gone into preterm labor before. Have had surgery on their cervix. Are younger than age 1  or older than age 37. Are African American. Are pregnant with twins or multiple babies (multiple gestation). Take street drugs or smoke while pregnant. Do not gain enough weight while pregnant. Became pregnant shortly after having been pregnant. What are the symptoms of preterm labor? Symptoms of preterm labor include: Cramps similar to those that can happen during a menstrual period. The cramps may happen with diarrhea. Pain in the abdomen or lower back. Regular uterine contractions that may feel like tightening of the abdomen. A feeling of increased pressure in the pelvis. Increased watery or bloody mucus discharge from the vagina. Water breaking (ruptured amniotic sac). Why is it important to recognize signs of preterm labor? It is important to recognize signs of preterm labor because babies who are born prematurely may not be fully developed. This can put them at an increased risk for: Long-term (chronic) heart and lung problems. Difficulty immediately after birth with regulating body systems, including blood sugar, body temperature, heart rate, and breathing rate. Bleeding in the brain. Cerebral palsy. Learning difficulties. Death. These risks are highest for babies who are born before 34 weeks  of pregnancy. How is preterm labor treated? Treatment depends on the length of your pregnancy, your condition, and the health of your baby. It may involve: Having a stitch (suture) placed in your cervix to prevent your cervix from opening too early (cerclage). Taking or being given medicines, such as: Hormone medicines. These may be given early in pregnancy to help support the pregnancy. Medicine to stop contractions. Medicines to help mature the baby's lungs. These may be prescribed if the risk of delivery is high. Medicines to prevent your baby from developing cerebral palsy. If the labor happens before 34 weeks of pregnancy, you may need to stay in the hospital. What should I do if I  think I am in preterm labor? If you think that you are going into preterm labor, call your health care provider right away. How can I prevent preterm labor in future pregnancies? To increase your chance of having a full-term pregnancy: Do not use any tobacco products, such as cigarettes, chewing tobacco, and e-cigarettes. If you need help quitting, ask your health care provider. Do not use street drugs or medicines that have not been prescribed to you during your pregnancy. Talk with your health care provider before taking any herbal supplements, even if you have been taking them regularly. Make sure you gain a healthy amount of weight during your pregnancy. Watch for infection. If you think that you might have an infection, get it checked right away. Make sure to tell your health care provider if you have gone into preterm labor before. This information is not intended to replace advice given to you by your health care provider. Make sure you discuss any questions you have with your health care provider. Document Revised: 09/01/2018 Document Reviewed: 10/01/2015 Elsevier Patient Education  2020 [employer].

## 2023-02-23 ENCOUNTER — Other Ambulatory Visit (HOSPITAL_COMMUNITY)
Admission: RE | Admit: 2023-02-23 | Discharge: 2023-02-23 | Disposition: A | Discharge status: Home / Self Care | Payer: Medicaid Other | Source: Ambulatory Visit | Attending: Obstetrics & Gynecology | Admitting: Obstetrics & Gynecology

## 2023-02-23 ENCOUNTER — Encounter: Payer: Self-pay | Admitting: Obstetrics & Gynecology

## 2023-02-23 ENCOUNTER — Ambulatory Visit (INDEPENDENT_AMBULATORY_CARE_PROVIDER_SITE_OTHER): Payer: Medicaid Other

## 2023-02-23 ENCOUNTER — Ambulatory Visit (INDEPENDENT_AMBULATORY_CARE_PROVIDER_SITE_OTHER): Payer: Medicaid Other | Admitting: Obstetrics & Gynecology

## 2023-02-23 VITALS — BP 103/68 | HR 94 | Wt 250.6 lb

## 2023-02-23 DIAGNOSIS — Z3480 Encounter for supervision of other normal pregnancy, unspecified trimester: Secondary | ICD-10-CM

## 2023-02-23 DIAGNOSIS — E669 Obesity, unspecified: Secondary | ICD-10-CM

## 2023-02-23 DIAGNOSIS — O0993 Supervision of high risk pregnancy, unspecified, third trimester: Secondary | ICD-10-CM | POA: Diagnosis present

## 2023-02-23 DIAGNOSIS — O3663X0 Maternal care for excessive fetal growth, third trimester, not applicable or unspecified: Secondary | ICD-10-CM

## 2023-02-23 DIAGNOSIS — Z3A36 36 weeks gestation of pregnancy: Secondary | ICD-10-CM

## 2023-02-23 DIAGNOSIS — D696 Thrombocytopenia, unspecified: Secondary | ICD-10-CM

## 2023-02-23 DIAGNOSIS — Z3483 Encounter for supervision of other normal pregnancy, third trimester: Secondary | ICD-10-CM | POA: Diagnosis not present

## 2023-02-23 DIAGNOSIS — Z302 Encounter for sterilization: Secondary | ICD-10-CM

## 2023-02-23 DIAGNOSIS — O99113 Other diseases of the blood and blood-forming organs and certain disorders involving the immune mechanism complicating pregnancy, third trimester: Secondary | ICD-10-CM

## 2023-02-23 NOTE — Progress Notes (Signed)
   LOW-RISK PREGNANCY VISIT Patient name: Sharon Lutz MRN [id number]  Date of birth: 01/29/1986 Chief Complaint:   Routine Prenatal Visit  History of Present Illness:   Sharon Lutz is a 37 y.o. [id number] female at [redacted]w[redacted]d with an Estimated Date of Delivery: 03/23/23 being seen today for ongoing management of a low-risk pregnancy.   -Suspected LGA -Gestational Thrombocytopenia    09/16/2022    9:04 AM 07/12/2022    1:41 PM  Depression screen PHQ 2/9  Decreased Interest 1 1  Down, Depressed, Hopeless 0 0  PHQ - 2 Score 1 1  Altered sleeping 0 0  Tired, decreased energy 1 2  Change in appetite 1 2  Feeling bad or failure about yourself  0 0  Trouble concentrating 0 1  Moving slowly or fidgety/restless 0 0  Suicidal thoughts 0 0  PHQ-9 Score 3 6    Today she reports occasional contractions and pelvic pressure . Also notes LE edema.  No HA or blurry vision.  Contractions: Not present. Vag. Bleeding: None.  Movement: Present. denies leaking of fluid. Review of Systems:   Pertinent items are noted in HPI Denies abnormal vaginal discharge w/ itching/odor/irritation, shortness of breath, chest pain, abdominal pain, severe nausea/vomiting, or problems with urination or bowel movements unless otherwise stated above. Pertinent History Reviewed:  Reviewed past medical,surgical, social, obstetrical and family history.  Reviewed problem list, medications and allergies.  Physical Assessment:   Vitals:   02/23/23 1043  BP: 103/68  Pulse: 94  Weight: 250 lb 9.6 oz (113.7 kg)  Body mass index is 37.01 kg/m.        Physical Examination:   General appearance: Well appearing, and in no distress  Mental status: Alert, oriented to person, place, and time  Skin: Warm & dry  Respiratory: Normal respiratory effort, no distress  Abdomen: Soft, gravid, nontender  Pelvic: Cervical exam performed  Dilation: Closed Effacement (%): Thick Station: -3  Extremities:  1+ edema  noted  Psych:  mood and affect appropriate  Fetal Status:     Movement: Present   cephalic,FHR 137 bpm,posterior placenta gr 2,AFI 13 cm,EFW 3244 g 88%,AC 95%   Chaperone: n/a    No results found for this or any previous visit (from the past 24 hour(s)).   Assessment & Plan:  1) Low-risk pregnancy [id number] at [redacted]w[redacted]d with an Estimated Date of Delivery: 03/23/23   -Gestational thrombocytopenia  Repeat CBC today  -Suspected LGA- growth today as above  -reviewed preeclampsia precautions -reviewed contraception, pt completed, desires sterilization   Meds: No orders of the defined types were placed in this encounter.  Labs/procedures today: growth scan  Plan:  Continue routine obstetrical care  Next visit: prefers in person    Reviewed: Preterm labor symptoms and general obstetric precautions including but not limited to vaginal bleeding, contractions, leaking of fluid and fetal movement were reviewed in detail with the patient.  All questions were answered. Pt has home bp cuff. Check bp weekly, let us know if >140/90.   Follow-up: Return in about 1 week (around 03/02/2023) for LROB visit.  Orders Placed This Encounter  Procedures   Culture, beta strep (group b only)   CBC    Myna Hidalgo, DO Attending Obstetrician & Gynecologist, Faculty Practice Center for [employer], [facility]

## 2023-02-23 NOTE — Progress Notes (Signed)
[place] 36 wks,cephalic,FHR 137 bpm,posterior placenta gr 2,AFI 13 cm,EFW 3244 g 88%,AC 95%

## 2023-02-24 LAB — CBC
Hematocrit: 33 % — ABNORMAL LOW (ref 34.0–46.6)
Hemoglobin: 10.8 g/dL — ABNORMAL LOW (ref 11.1–15.9)
MCH: 29.7 pg (ref 26.6–33.0)
MCHC: 32.7 g/dL (ref 31.5–35.7)
MCV: 91 fL (ref 79–97)
Platelets: 113 10*3/uL — ABNORMAL LOW (ref 150–450)
RBC: 3.64 x10E6/uL — ABNORMAL LOW (ref 3.77–5.28)
RDW: 12.7 % (ref 11.7–15.4)
WBC: 5.4 10*3/uL (ref 3.4–10.8)

## 2023-02-26 LAB — CULTURE, BETA STREP (GROUP B ONLY): Strep Gp B Culture: NEGATIVE

## 2023-02-28 LAB — CERVICOVAGINAL ANCILLARY ONLY
Chlamydia: NEGATIVE
Comment: NEGATIVE
Comment: NORMAL
Neisseria Gonorrhea: NEGATIVE

## 2023-03-02 ENCOUNTER — Encounter: Payer: Self-pay | Admitting: Advanced Practice Midwife

## 2023-03-02 ENCOUNTER — Ambulatory Visit (INDEPENDENT_AMBULATORY_CARE_PROVIDER_SITE_OTHER): Payer: Medicaid Other | Admitting: Advanced Practice Midwife

## 2023-03-02 VITALS — BP 111/67 | HR 85 | Wt 253.0 lb

## 2023-03-02 DIAGNOSIS — Z3483 Encounter for supervision of other normal pregnancy, third trimester: Secondary | ICD-10-CM

## 2023-03-02 DIAGNOSIS — Z3480 Encounter for supervision of other normal pregnancy, unspecified trimester: Secondary | ICD-10-CM

## 2023-03-02 DIAGNOSIS — Z3A37 37 weeks gestation of pregnancy: Secondary | ICD-10-CM

## 2023-03-02 NOTE — Progress Notes (Signed)
   LOW-RISK PREGNANCY VISIT Patient name: Sharon Lutz MRN [id number]  Date of birth: [birth year] Chief Complaint:   Routine Prenatal Visit  History of Present Illness:   Sharon Lutz is a 37 y.o. [id number] female at [redacted]w[redacted]d with an Estimated Date of Delivery: 03/23/23 being seen today for ongoing management of a low-risk pregnancy.  Today she reports  having fine rash on chest . Contractions: Irritability. Vag. Bleeding: None.  Movement: Present. denies leaking of fluid. Review of Systems:   Pertinent items are noted in HPI Denies abnormal vaginal discharge w/ itching/odor/irritation, headaches, visual changes, shortness of breath, chest pain, abdominal pain, severe nausea/vomiting, or problems with urination or bowel movements unless otherwise stated above. Pertinent History Reviewed:  Reviewed past medical,surgical, social, obstetrical and family history.  Reviewed problem list, medications and allergies. Physical Assessment:   Vitals:   03/02/23 1013  BP: 111/67  Pulse: 85  Weight: 253 lb (114.8 kg)  Body mass index is 37.36 kg/m.        Physical Examination:   General appearance: Well appearing, and in no distress  Mental status: Alert, oriented to person, place, and time  Skin: Warm & dry  Cardiovascular: Normal heart rate noted  Respiratory: Normal respiratory effort, no distress  Abdomen: Soft, gravid, nontender  Pelvic: Cervical exam performed  Dilation: Closed Effacement (%): Thick Station: Ballotable  Extremities: Edema: Trace (ankles)  Fetal Status: Fetal Heart Rate (bpm): 153 Fundal Height: 37 cm Movement: Present Presentation: Vertex  No results found for this or any previous visit (from the past 24 hour(s)).  Assessment & Plan:  1) Low-risk pregnancy [id number] at [redacted]w[redacted]d with an Estimated Date of Delivery: 03/23/23   2) Gest thrombocytopenia, needs repeat platelets next week (36wk 113K)  3) Rash on chest, continue moisturizing well   Meds: No orders of  the defined types were placed in this encounter.  Labs/procedures today: SVE  Plan:  Continue routine obstetrical care   Reviewed: Term labor symptoms and general obstetric precautions including but not limited to vaginal bleeding, contractions, leaking of fluid and fetal movement were reviewed in detail with the patient.  All questions were answered. Has home bp cuff. Check bp weekly, let us know if >140/90.   Follow-up: Return for As scheduled; check platelets w next visit.  No orders of the defined types were placed in this encounter.  Arabella Merles CNM 03/02/2023 10:30 AM

## 2023-03-09 ENCOUNTER — Ambulatory Visit: Payer: Medicaid Other | Admitting: Obstetrics and Gynecology

## 2023-03-09 VITALS — BP 112/70 | HR 92 | Wt 251.0 lb

## 2023-03-09 DIAGNOSIS — Z3A38 38 weeks gestation of pregnancy: Secondary | ICD-10-CM

## 2023-03-09 DIAGNOSIS — O99113 Other diseases of the blood and blood-forming organs and certain disorders involving the immune mechanism complicating pregnancy, third trimester: Secondary | ICD-10-CM

## 2023-03-09 DIAGNOSIS — Z3480 Encounter for supervision of other normal pregnancy, unspecified trimester: Secondary | ICD-10-CM

## 2023-03-09 DIAGNOSIS — D696 Thrombocytopenia, unspecified: Secondary | ICD-10-CM

## 2023-03-09 NOTE — Progress Notes (Signed)
   PRENATAL VISIT NOTE  Subjective:  Sharon Lutz is a 37 y.o. [id number] at [redacted]w[redacted]d being seen today for ongoing prenatal care.  She is currently monitored for the following issues for this low-risk pregnancy and has Anemia; Encounter for supervision of normal pregnancy, antepartum; Obesity (BMI 35.0-39.9 without comorbidity); Alpha thalassemia silent carrier; Request for sterilization; and Gestational thrombocytopenia (HCC) on their problem list.  Patient reports  pelvic pressure .  Contractions: Not present. Vag. Bleeding: None.  Movement: Present. Denies leaking of fluid.   The following portions of the patient's history were reviewed and updated as appropriate: allergies, current medications, past family history, past medical history, past social history, past surgical history and problem list.   Objective:   Vitals:   03/09/23 1054  BP: 112/70  Pulse: 92  Weight: 251 lb (113.9 kg)    Fetal Status: Fetal Heart Rate (bpm): 135 Fundal Height: 39 cm Movement: Present  Presentation: Vertex  General:  Alert, oriented and cooperative. Patient is in no acute distress.  Skin: Skin is warm and dry. No rash noted.   Cardiovascular: Normal heart rate noted  Respiratory: Normal respiratory effort, no problems with respiration noted  Abdomen: Soft, gravid, appropriate for gestational age.  Pain/Pressure: Present     Pelvic: Cervical exam performed in the presence of a chaperone Dilation: 2 Effacement (%): 50 Station: -3  Extremities: Normal range of motion.     Mental Status: Normal mood and affect. Normal behavior. Normal judgment and thought content.   Assessment and Plan:  Pregnancy: [id number] at [redacted]w[redacted]d 1. Supervision of other normal pregnancy, antepartum BP and FHR normal Feeling regular fetal movement  FH appropriate   2. Benign gestational thrombocytopenia in third trimester [facility]) Repeat today  - CBC  3. [redacted] weeks gestation of pregnancy Interested in vit d, discussed checking  today  Labor precautions given   Preterm labor symptoms and general obstetric precautions including but not limited to vaginal bleeding, contractions, leaking of fluid and fetal movement were reviewed in detail with the patient. Please refer to After Visit Summary for other counseling recommendations.   Return in one week for routine prenatal   Future Appointments  Date Time Provider Department Center  03/16/2023  9:10 AM Cheral Marker, CNM [facility]  03/23/2023  9:50 AM Arabella Merles, CNM [facility]    Albertine Grates, FNP

## 2023-03-10 LAB — CBC
Hematocrit: 34.7 % (ref 34.0–46.6)
Hemoglobin: 11.6 g/dL (ref 11.1–15.9)
MCH: 29.8 pg (ref 26.6–33.0)
MCHC: 33.4 g/dL (ref 31.5–35.7)
MCV: 89 fL (ref 79–97)
Platelets: 130 10*3/uL — ABNORMAL LOW (ref 150–450)
RBC: 3.89 x10E6/uL (ref 3.77–5.28)
RDW: 12.8 % (ref 11.7–15.4)
WBC: 5.6 10*3/uL (ref 3.4–10.8)

## 2023-03-16 ENCOUNTER — Ambulatory Visit (INDEPENDENT_AMBULATORY_CARE_PROVIDER_SITE_OTHER): Payer: Medicaid Other | Admitting: Women's Health

## 2023-03-16 ENCOUNTER — Encounter: Payer: Self-pay | Admitting: Women's Health

## 2023-03-16 VITALS — BP 107/71 | HR 87 | Wt 254.0 lb

## 2023-03-16 DIAGNOSIS — O26893 Other specified pregnancy related conditions, third trimester: Secondary | ICD-10-CM

## 2023-03-16 DIAGNOSIS — D696 Thrombocytopenia, unspecified: Secondary | ICD-10-CM

## 2023-03-16 DIAGNOSIS — Z3A39 39 weeks gestation of pregnancy: Secondary | ICD-10-CM

## 2023-03-16 DIAGNOSIS — Z3483 Encounter for supervision of other normal pregnancy, third trimester: Secondary | ICD-10-CM

## 2023-03-16 DIAGNOSIS — E669 Obesity, unspecified: Secondary | ICD-10-CM

## 2023-03-16 DIAGNOSIS — O99113 Other diseases of the blood and blood-forming organs and certain disorders involving the immune mechanism complicating pregnancy, third trimester: Secondary | ICD-10-CM

## 2023-03-16 DIAGNOSIS — R12 Heartburn: Secondary | ICD-10-CM

## 2023-03-16 MED ORDER — FAMOTIDINE 20 MG PO TABS: 20.0000 mg | ORAL_TABLET | Freq: Every day | ORAL | 0 refills | Status: DC

## 2023-03-16 NOTE — Progress Notes (Addendum)
PRENATAL VISIT NOTE  Subjective:  Sharon Lutz is a 37 y.o. [id number] at [redacted]w[redacted]d being seen today for ongoing prenatal care.  She is currently monitored for the following issues for this low-risk pregnancy and has Anemia; Encounter for supervision of normal pregnancy, antepartum; Obesity (BMI 35.0-39.9 without comorbidity); Alpha thalassemia silent carrier; Request for sterilization; and Gestational thrombocytopenia (HCC) on their problem list.  Patient reports fatigue.  Contractions: Irritability. Vag. Bleeding: None.  Movement: Present. Denies leaking of fluid.   The following portions of the patient's history were reviewed and updated as appropriate: allergies, current medications, past family history, past medical history, past social history, past surgical history and problem list.   Objective:   Vitals:   03/16/23 0914  BP: 107/71  Pulse: 87  Weight: 115.2 kg    Fetal Status: Fetal Heart Rate (bpm): 130 Fundal Height: 39 cm Movement: Present  Presentation: Vertex  General:  Alert, oriented and cooperative. Patient is in no acute distress.  Skin: Skin is warm and dry. No rash noted.   Cardiovascular: Normal heart rate noted  Respiratory: Normal respiratory effort, no problems with respiration noted  Abdomen: Soft, gravid, appropriate for gestational age.  Pain/Pressure: Present     Pelvic:   Dilation: 4 Effacement (%): 50 Station: -2 membrane sweep  Extremities: Normal range of motion.     Mental Status: Normal mood and affect. Normal behavior. Normal judgment and thought content.   Assessment and Plan:  Pregnancy: [id number] at [redacted]w[redacted]d There are no diagnoses linked to this encounter. Term labor symptoms and general obstetric precautions including but not limited to vaginal bleeding, contractions, leaking of fluid and fetal movement were reviewed in detail with the patient. Please refer to After Visit Summary for other counseling recommendations.   Encounter for supervision of  other normal pregnancy in third trimester  Patient reports feeling well over all. Reports vigorous fetal movement.   2.   39 weeks of pregnancy  Reviewed signs of labor. Cervix 4/50/-2  today and membrane sweep performed. Provided anticipatory guidance for labor and next appointment.    Return in about 1 week (around 03/23/2023) for LROB w/ NST.  Future Appointments  Date Time Provider Department Center  03/23/2023  9:50 AM Arabella Merles, CNM [facility]    Cleda Mccreedy, Student-MidWife  I spoke with and examined patient and agree with resident/PA-S/MS/SNM's note and plan of care. Please see my note below for formal documentation Cheral Marker, CNM, [facility] 03/16/2023 10:08 AM      LOW-RISK PREGNANCY VISIT Patient name: Sharon Lutz MRN [id number]  Date of birth: [birth year] Chief Complaint:   Routine Prenatal Visit  History of Present Illness:   Sharon Lutz is a 37 y.o. W4X3244 female at [redacted]w[redacted]d with an Estimated Date of Delivery: 03/23/23 being seen today for ongoing management of a low-risk pregnancy.   Today she reports  wants membrane sweep . Contractions: Irritability. Vag. Bleeding: None.  Movement: Present. denies leaking of fluid.     09/16/2022    9:04 AM 07/12/2022    1:41 PM  Depression screen PHQ 2/9  Decreased Interest 1 1  Down, Depressed, Hopeless 0 0  PHQ - 2 Score 1 1  Altered sleeping 0 0  Tired, decreased energy 1 2  Change in appetite 1 2  Feeling bad or failure about yourself  0 0  Trouble concentrating 0 1  Moving slowly or fidgety/restless 0 0  Suicidal thoughts 0 0  PHQ-9 Score 3 6  07/12/2022    1:42 PM  GAD 7 : Generalized Anxiety Score  Nervous, Anxious, on Edge 0  Control/stop worrying 0  Worry too much - different things 0  Trouble relaxing 0  Restless 0  Easily annoyed or irritable 0  Afraid - awful might happen 0  Total GAD 7 Score 0      Review of Systems:   Pertinent items are noted in  HPI Denies abnormal vaginal discharge w/ itching/odor/irritation, headaches, visual changes, shortness of breath, chest pain, abdominal pain, severe nausea/vomiting, or problems with urination or bowel movements unless otherwise stated above. Pertinent History Reviewed:  Reviewed past medical,surgical, social, obstetrical and family history.  Reviewed problem list, medications and allergies. Physical Assessment:   Vitals:   03/16/23 0914  BP: 107/71  Pulse: 87  Weight: 254 lb (115.2 kg)  Body mass index is 37.51 kg/m.        Physical Examination:   General appearance: Well appearing, and in no distress  Mental status: Alert, oriented to person, place, and time  Skin: Warm & dry  Cardiovascular: Normal heart rate noted  Respiratory: Normal respiratory effort, no distress  Abdomen: Soft, gravid, nontender  Pelvic: Cervical exam performed by Everlena Cooper, SNM and membranes swept Dilation: 4 Effacement (%): 50 Station: -2, repeat exam/sweep by me  Extremities:    Fetal Status: Fetal Heart Rate (bpm): 130 Fundal Height: 39 cm Movement: Present Presentation: Vertex  Chaperone:  me    No results found for this or any previous visit (from the past 24 hour(s)).  Assessment & Plan:  1) Low-risk pregnancy [id number] at [redacted]w[redacted]d with an Estimated Date of Delivery: 03/23/23   2) Gestational thrombocytopenia, last platelet 130 on 10/16   Meds: No orders of the defined types were placed in this encounter.  Labs/procedures today: SVE and membrane sweep  Plan:  Continue routine obstetrical care  Next visit: prefers will be in person for postdates nst     Reviewed: Term labor symptoms and general obstetric precautions including but not limited to vaginal bleeding, contractions, leaking of fluid and fetal movement were reviewed in detail with the patient.  All questions were answered. Does have home bp cuff. Office bp cuff given: not applicable. Check bp weekly, let us know if consistently >140 and/or  >90.  Follow-up: Return in about 1 week (around 03/23/2023) for LROB w/ NST.  Future Appointments  Date Time Provider Department Center  03/23/2023  9:50 AM Arabella Merles, CNM [facility]  03/23/2023  9:50 AM [facility] NURSE [facility]    No orders of the defined types were placed in this encounter.  Cheral Marker CNM, [facility] 03/16/2023 10:09 AM

## 2023-03-16 NOTE — Patient Instructions (Signed)
Amarilys, thank you for choosing our office today! We appreciate the opportunity to meet your healthcare needs. You may receive a short survey by mail, e-mail, or through [employer]. If you are happy with your care we would appreciate if you could take just a few minutes to complete the survey questions. We read all of your comments and take your feedback very seriously. Thank you again for choosing our office.  Center for [employer] Team at [facility]  [facility] at [facility] ([address] [place], [place] [address]) Entrance C, located off of [address] Free 24/7 valet parking   CLASSES: Go to [email or web address] to register for classes (childbirth, breastfeeding, waterbirth, infant CPR, daddy bootcamp, etc.)  Call the office [phone]) or go to [facility] if: You begin to have strong, frequent contractions Your water breaks.  Sometimes it is a big gush of fluid, sometimes it is just a trickle that keeps getting your panties wet or running down your legs You have vaginal bleeding.  It is normal to have a small amount of spotting if your cervix was checked.  You don't feel your baby moving like normal.  If you don't, get you something to eat and drink and lay down and focus on feeling your baby move.   If your baby is still not moving like normal, you should call the office or go to [facility].  Call the office [phone]) or go to [facility] hospital for these signs of pre-eclampsia: Severe headache that does not go away with Tylenol Visual changes- seeing spots, double, blurred vision Pain under your right breast or upper abdomen that does not go away with Tums or heartburn medicine Nausea and/or vomiting Severe swelling in your hands, feet, and face   [facility] Pediatricians/Family Doctors [facility]): [address] Colette Ribas, [phone]           [facility]: [address], [phone]                 [facility]): [address], [phone] (call to ask if accepting patients) [facility]: [address], [place], [phone]    [facility] Pediatricians/Family Doctors [facility]): [address], [phone] [facility]: [address] [place], [phone] [facility]: [address], [phone]  [facility]  [facility]): [phone] [facility]: [address], [phone]   [facility] Doctors [facility]: [address], [phone]  [facility] Doctors  [facility]: [phone], [phone]  Home Blood Pressure Monitoring for Patients   Your provider has recommended that you check your blood pressure (BP) at least once a week at home. If you do not have a blood pressure cuff at home, one will be provided for you. Contact your provider if you have not received your monitor within 1 week.   Helpful Tips for Accurate Home Blood Pressure Checks  Don't smoke, exercise, or drink caffeine 30 minutes before checking your BP Use the restroom before checking your BP (a full bladder can raise your pressure) Relax in a comfortable upright chair Feet on the ground Left arm resting comfortably on a flat surface at the level of your heart Legs uncrossed Back supported Sit quietly and don't talk Place the cuff on your bare arm Adjust snuggly, so that only two fingertips  can fit between your skin and the top of the cuff Check 2 readings separated by at least one minute Keep a log of your BP readings For a visual, please reference this diagram: http://ccnc.care/bpdiagram  Provider Name: [facility]     Phone: [phone]  Zone 1: ALL CLEAR  Continue to monitor your symptoms:  BP reading is less than 140 (top number) or less than 90 (bottom number)  No right  upper stomach pain No headaches or seeing spots No feeling nauseated or throwing up No swelling in face and hands  Zone 2: CAUTION Call your doctor's office for any of the following:  BP reading is greater than 140 (top number) or greater than 90 (bottom number)  Stomach pain under your ribs in the middle or right side Headaches or seeing spots Feeling nauseated or throwing up Swelling in face and hands  Zone 3: EMERGENCY  Seek immediate medical care if you have any of the following:  BP reading is greater than160 (top number) or greater than 110 (bottom number) Severe headaches not improving with Tylenol Serious difficulty catching your breath Any worsening symptoms from Zone 2   Braxton Hicks Contractions Contractions of the uterus can occur throughout pregnancy, but they are not always a sign that you are in labor. You may have practice contractions called Braxton Hicks contractions. These false labor contractions are sometimes confused with true labor. What are Deberah Pelton contractions? Braxton Hicks contractions are tightening movements that occur in the muscles of the uterus before labor. Unlike true labor contractions, these contractions do not result in opening (dilation) and thinning of the cervix. Toward the end of pregnancy (32-34 weeks), Braxton Hicks contractions can happen more often and may become stronger. These contractions are sometimes difficult to tell apart from true labor because they can be very uncomfortable. You should not feel embarrassed if you go to the hospital with false labor. Sometimes, the only way to tell if you are in true labor is for your health care provider to look for changes in the cervix. The health care provider will do a physical exam and may monitor your contractions. If you are not in true labor, the exam should show that your cervix is not dilating and your water has not broken. If there are no other health problems associated with your  pregnancy, it is completely safe for you to be sent home with false labor. You may continue to have Braxton Hicks contractions until you go into true labor. How to tell the difference between true labor and false labor True labor Contractions last 30-70 seconds. Contractions become very regular. Discomfort is usually felt in the top of the uterus, and it spreads to the lower abdomen and low back. Contractions do not go away with walking. Contractions usually become more intense and increase in frequency. The cervix dilates and gets thinner. False labor Contractions are usually shorter and not as strong as true labor contractions. Contractions are usually irregular. Contractions are often felt in the front of the lower abdomen and in the groin. Contractions may go away when you walk around or change positions while lying down. Contractions get weaker and are shorter-lasting as time goes on. The cervix usually does not dilate or become thin. Follow these instructions at home:  Take over-the-counter and prescription medicines only as told by your health care provider. Keep up with your usual exercises and follow other instructions from your health care provider. Eat and drink lightly if you think  you are going into labor. If Braxton Hicks contractions are making you uncomfortable: Change your position from lying down or resting to walking, or change from walking to resting. Sit and rest in a tub of warm water. Drink enough fluid to keep your urine pale yellow. Dehydration may cause these contractions. Do slow and deep breathing several times an hour. Keep all follow-up prenatal visits as told by your health care provider. This is important. Contact a health care provider if: You have a fever. You have continuous pain in your abdomen. Get help right away if: Your contractions become stronger, more regular, and closer together. You have fluid leaking or gushing from your vagina. You pass  blood-tinged mucus (bloody show). You have bleeding from your vagina. You have low back pain that you never had before. You feel your baby's head pushing down and causing pelvic pressure. Your baby is not moving inside you as much as it used to. Summary Contractions that occur before labor are called Braxton Hicks contractions, false labor, or practice contractions. Braxton Hicks contractions are usually shorter, weaker, farther apart, and less regular than true labor contractions. True labor contractions usually become progressively stronger and regular, and they become more frequent. Manage discomfort from [facility] contractions by changing position, resting in a warm bath, drinking plenty of water, or practicing deep breathing. This information is not intended to replace advice given to you by your health care provider. Make sure you discuss any questions you have with your health care provider. Document Revised: 04/22/2017 Document Reviewed: 09/23/2016 Elsevier Patient Education  2020 [employer].

## 2023-03-16 NOTE — Addendum Note (Signed)
Addended by: Shawna Clamp R on: 03/16/2023 10:20 AM   Modules accepted: Orders

## 2023-03-20 ENCOUNTER — Encounter: Payer: Self-pay | Admitting: Women's Health

## 2023-03-21 ENCOUNTER — Encounter: Payer: Self-pay | Admitting: Women's Health

## 2023-03-23 ENCOUNTER — Other Ambulatory Visit: Payer: Self-pay | Admitting: Advanced Practice Midwife

## 2023-03-23 ENCOUNTER — Ambulatory Visit (INDEPENDENT_AMBULATORY_CARE_PROVIDER_SITE_OTHER): Payer: Medicaid Other | Admitting: Advanced Practice Midwife

## 2023-03-23 ENCOUNTER — Telehealth (HOSPITAL_COMMUNITY): Payer: Self-pay | Admitting: *Deleted

## 2023-03-23 ENCOUNTER — Encounter (HOSPITAL_COMMUNITY): Payer: Self-pay | Admitting: *Deleted

## 2023-03-23 ENCOUNTER — Encounter: Payer: Self-pay | Admitting: Advanced Practice Midwife

## 2023-03-23 ENCOUNTER — Other Ambulatory Visit: Payer: Medicaid Other

## 2023-03-23 VITALS — BP 106/74 | HR 93 | Wt 259.0 lb

## 2023-03-23 DIAGNOSIS — Z3A40 40 weeks gestation of pregnancy: Secondary | ICD-10-CM | POA: Diagnosis not present

## 2023-03-23 DIAGNOSIS — Z3480 Encounter for supervision of other normal pregnancy, unspecified trimester: Secondary | ICD-10-CM

## 2023-03-23 DIAGNOSIS — Z3483 Encounter for supervision of other normal pregnancy, third trimester: Secondary | ICD-10-CM | POA: Diagnosis not present

## 2023-03-23 NOTE — Progress Notes (Signed)
   LOW-RISK PREGNANCY VISIT Patient name: Sharon Lutz MRN [id number]  Date of birth: Nov 11, 1985 Chief Complaint:   Routine Prenatal Visit (NST)  History of Present Illness:   Sharon Lutz is a 37 y.o. W2N5621 female at [redacted]w[redacted]d with an Estimated Date of Delivery: 03/23/23 being seen today for ongoing management of a low-risk pregnancy.  Today she reports  ready for labor!; has felt more damp, but no signs of SROM . Contractions: Irregular.  .  Movement: Present. denies leaking of fluid. Review of Systems:   Pertinent items are noted in HPI Denies abnormal vaginal discharge w/ itching/odor/irritation, headaches, visual changes, shortness of breath, chest pain, abdominal pain, severe nausea/vomiting, or problems with urination or bowel movements unless otherwise stated above. Pertinent History Reviewed:  Reviewed past medical,surgical, social, obstetrical and family history.  Reviewed problem list, medications and allergies. Physical Assessment:   Vitals:   03/23/23 1004  BP: 106/74  Pulse: 93  Weight: 259 lb (117.5 kg)  Body mass index is 38.25 kg/m.        Physical Examination:   General appearance: Well appearing, and in no distress  Mental status: Alert, oriented to person, place, and time  Skin: Warm & dry  Cardiovascular: Normal heart rate noted  Respiratory: Normal respiratory effort, no distress  Abdomen: Soft, gravid, nontender  Pelvic: Cervical exam performed  Dilation: 4 Effacement (%): 50 Station: -3  Extremities: Edema: None  Fetal Status: Fetal Heart Rate (bpm): 140s NST   Movement: Present   NST: baseline 130s, +accels, no decels, reactive   No results found for this or any previous visit (from the past 24 hour(s)).  Assessment & Plan:  1) Low-risk pregnancy [id number] at 104w0d with an Estimated Date of Delivery: 03/23/23   2) Postdates management, PDIOL scheduled for 03/29/23 to show up at 2345 (for 11/6 midnight); orders placed; declines membrane  sweep   Meds: No orders of the defined types were placed in this encounter.  Labs/procedures today: NST; SVE  Plan:  Continue routine obstetrical care with postdates IOL scheduled for 41.0wks  Reviewed: Term labor symptoms and general obstetric precautions including but not limited to vaginal bleeding, contractions, leaking of fluid and fetal movement were reviewed in detail with the patient.  All questions were answered. Has home bp cuff. Check bp weekly, let us know if >140/90.   Follow-up: Return for As scheduled.  No orders of the defined types were placed in this encounter.  Arabella Merles CNM 03/23/2023 11:14 AM

## 2023-03-23 NOTE — Telephone Encounter (Signed)
Preadmission screen  

## 2023-03-23 NOTE — Patient Instructions (Signed)
Try the positions on www.milescircuit.com to help the baby get into a good position for labor! 

## 2023-03-26 ENCOUNTER — Encounter (HOSPITAL_COMMUNITY): Payer: Self-pay | Admitting: Obstetrics and Gynecology

## 2023-03-26 ENCOUNTER — Inpatient Hospital Stay (HOSPITAL_COMMUNITY)
Admission: AD | Admit: 2023-03-26 | Discharge: 2023-03-28 | DRG: 797 | Disposition: A | Discharge status: Home / Self Care | Payer: Medicaid Other | Attending: Obstetrics and Gynecology | Admitting: Obstetrics and Gynecology

## 2023-03-26 ENCOUNTER — Other Ambulatory Visit: Payer: Self-pay

## 2023-03-26 DIAGNOSIS — O99214 Obesity complicating childbirth: Secondary | ICD-10-CM | POA: Diagnosis present

## 2023-03-26 DIAGNOSIS — O480 Post-term pregnancy: Principal | ICD-10-CM | POA: Diagnosis present

## 2023-03-26 DIAGNOSIS — Z833 Family history of diabetes mellitus: Secondary | ICD-10-CM | POA: Diagnosis not present

## 2023-03-26 DIAGNOSIS — O4292 Full-term premature rupture of membranes, unspecified as to length of time between rupture and onset of labor: Secondary | ICD-10-CM | POA: Diagnosis present

## 2023-03-26 DIAGNOSIS — Z302 Encounter for sterilization: Secondary | ICD-10-CM | POA: Diagnosis not present

## 2023-03-26 DIAGNOSIS — D696 Thrombocytopenia, unspecified: Secondary | ICD-10-CM | POA: Diagnosis present

## 2023-03-26 DIAGNOSIS — O9912 Other diseases of the blood and blood-forming organs and certain disorders involving the immune mechanism complicating childbirth: Secondary | ICD-10-CM | POA: Diagnosis present

## 2023-03-26 DIAGNOSIS — Z148 Genetic carrier of other disease: Secondary | ICD-10-CM

## 2023-03-26 DIAGNOSIS — Z3A40 40 weeks gestation of pregnancy: Secondary | ICD-10-CM | POA: Diagnosis not present

## 2023-03-26 DIAGNOSIS — Z7982 Long term (current) use of aspirin: Secondary | ICD-10-CM | POA: Diagnosis not present

## 2023-03-26 DIAGNOSIS — O4202 Full-term premature rupture of membranes, onset of labor within 24 hours of rupture: Secondary | ICD-10-CM | POA: Diagnosis not present

## 2023-03-26 MED ORDER — ONDANSETRON HCL 4 MG/2ML IJ SOLN: 4.0000 mg | Freq: Four times a day (QID) | INTRAMUSCULAR | Status: DC | PRN

## 2023-03-26 MED ORDER — LACTATED RINGERS IV SOLN: INTRAVENOUS | Status: DC

## 2023-03-26 MED ORDER — TERBUTALINE SULFATE 1 MG/ML IJ SOLN: 0.2500 mg | Freq: Once | INTRAMUSCULAR | Status: DC | PRN

## 2023-03-26 MED ORDER — LIDOCAINE HCL (PF) 1 % IJ SOLN: 30.0000 mL | INTRAMUSCULAR | Status: DC | PRN

## 2023-03-26 MED ORDER — OXYCODONE-ACETAMINOPHEN 5-325 MG PO TABS: 2.0000 | ORAL_TABLET | ORAL | Status: DC | PRN

## 2023-03-26 MED ORDER — ACETAMINOPHEN 325 MG PO TABS: 650.0000 mg | ORAL_TABLET | ORAL | Status: DC | PRN

## 2023-03-26 MED ORDER — OXYTOCIN-SODIUM CHLORIDE 30-0.9 UT/500ML-% IV SOLN
2.5000 [IU]/h | INTRAVENOUS | Status: DC
  Filled 2023-03-26: qty 500

## 2023-03-26 MED ORDER — FENTANYL-BUPIVACAINE-NACL 0.5-0.125-0.9 MG/250ML-% EP SOLN
12.0000 mL/h | EPIDURAL | Status: DC | PRN
  Administered 2023-03-27: 12 mL/h via EPIDURAL
  Filled 2023-03-26: qty 250

## 2023-03-26 MED ORDER — EPHEDRINE 5 MG/ML INJ
10.0000 mg | INTRAVENOUS | Status: DC | PRN
Start: 2023-03-26 — End: 2023-03-27

## 2023-03-26 MED ORDER — FENTANYL CITRATE (PF) 100 MCG/2ML IJ SOLN: 100.0000 ug | INTRAMUSCULAR | Status: DC | PRN

## 2023-03-26 MED ORDER — SOD CITRATE-CITRIC ACID 500-334 MG/5ML PO SOLN: 30.0000 mL | ORAL | Status: DC | PRN

## 2023-03-26 MED ORDER — LACTATED RINGERS IV SOLN
500.0000 mL | Freq: Once | INTRAVENOUS | Status: AC
  Administered 2023-03-27: 500 mL via INTRAVENOUS

## 2023-03-26 MED ORDER — PHENYLEPHRINE 80 MCG/ML (10ML) SYRINGE FOR IV PUSH (FOR BLOOD PRESSURE SUPPORT)
80.0000 ug | PREFILLED_SYRINGE | INTRAVENOUS | Status: DC | PRN
Start: 2023-03-26 — End: 2023-03-27

## 2023-03-26 MED ORDER — DIPHENHYDRAMINE HCL 50 MG/ML IJ SOLN
12.5000 mg | INTRAMUSCULAR | Status: DC | PRN
Start: 2023-03-26 — End: 2023-03-27

## 2023-03-26 MED ORDER — OXYTOCIN BOLUS FROM INFUSION
333.0000 mL | Freq: Once | INTRAVENOUS | Status: AC
  Administered 2023-03-27: 333 mL via INTRAVENOUS

## 2023-03-26 MED ORDER — OXYCODONE-ACETAMINOPHEN 5-325 MG PO TABS: 1.0000 | ORAL_TABLET | ORAL | Status: DC | PRN

## 2023-03-26 MED ORDER — LACTATED RINGERS IV SOLN: 500.0000 mL | INTRAVENOUS | Status: DC | PRN

## 2023-03-26 MED ORDER — OXYTOCIN-SODIUM CHLORIDE 30-0.9 UT/500ML-% IV SOLN: 1.0000 m[IU]/min | INTRAVENOUS | Status: DC

## 2023-03-26 NOTE — H&P (Shared)
OBSTETRIC ADMISSION HISTORY AND PHYSICAL  Sharon Lutz is a 37 y.o. female [phone] with IUP at [redacted]w[redacted]d by LMP presenting for SROM. She reports +FMs, no blurry vision, headaches or peripheral edema, and RUQ pain.  She plans on pumping and feeding EBM. She request BTL for birth control. She received her prenatal care at [facility]   Dating: By LMP --->  Estimated Date of Delivery: 03/23/23  @[redacted]w[redacted]d , 3244 88%, posterior placenta, cephalic    Prenatal History/Complications:  -Thrombocytopenia  Past Medical History: Past Medical History:  Diagnosis Date   Anemia    Nausea 01/31/2013   Pregnant 01/31/2013    Past Surgical History: History reviewed. No pertinent surgical history.  Obstetrical History: OB History     Gravida  5   Para  2   Term  2   Preterm      AB  2   Living  2      SAB  2   IAB      Ectopic      Multiple      Live Births  2           Social History Social History   Socioeconomic History   Marital status: Married    Spouse name: Not on file   Number of children: Not on file   Years of education: Not on file   Highest education level: Not on file  Occupational History   Not on file  Tobacco Use   Smoking status: Never   Smokeless tobacco: Never  Vaping Use   Vaping status: Never Used  Substance and Sexual Activity   Alcohol use: Not Currently    Comment: occ   Drug use: No   Sexual activity: Yes    Birth control/protection: None  Other Topics Concern   Not on file  Social History Narrative   Not on file   Social Determinants of Health   Financial Resource Strain: Low Risk  (07/12/2022)   Overall Financial Resource Strain (CARDIA)    Difficulty of Paying Living Expenses: Not hard at all  Food Insecurity: No Food Insecurity (07/12/2022)   Hunger Vital Sign    Worried About Running Out of Food in the Last Year: Never true    Ran Out of Food in the Last Year: Never true  Transportation Needs: No Transportation Needs  (07/12/2022)   PRAPARE - Administrator, Civil Service (Medical): No    Lack of Transportation (Non-Medical): No  Physical Activity: Insufficiently Active (07/12/2022)   Exercise Vital Sign    Days of Exercise per Week: 3 days    Minutes of Exercise per Session: 30 min  Stress: No Stress Concern Present (07/12/2022)   [employer] of Occupational Health - Occupational Stress Questionnaire    Feeling of Stress : Not at all  Social Connections: Socially Isolated (09/16/2022)   Social Connection and Isolation Panel [NHANES]    Frequency of Communication with Friends and Family: Once a week    Frequency of Social Gatherings with Friends and Family: Once a week    Attends Religious Services: Never    Database administrator or Organizations: No    Attends Engineer, structural: Never    Marital Status: Married    Family History: Family History  Problem Relation Age of Onset   Diabetes Maternal Grandmother    Diabetes Maternal Aunt    Diabetes Maternal Uncle     Allergies: No Known Allergies  Medications Prior  to Admission  Medication Sig Dispense Refill Last Dose   aspirin EC 81 MG tablet Take 81 mg by mouth daily. Swallow whole.      ferrous fumarate (HEMOCYTE - 106 MG FE) 325 (106 FE) MG TABS tablet Take 1 tablet by mouth.      prenatal vitamin w/FE, FA (PRENATAL 1 + 1) 27-1 MG TABS tablet Take 1 tablet by mouth daily at 12 noon. 30 tablet 11      Review of Systems   All systems reviewed and negative except as stated in HPI  Blood pressure 118/68, pulse 80, temperature (!) 97.5 F (36.4 C), temperature source Oral, resp. rate 18, height 5\' 9"  (1.753 m), weight 118.3 kg, last menstrual period 06/16/2022, SpO2 100%. General appearance: alert and cooperative Lungs: clear to auscultation bilaterally Heart: regular rate and rhythm Abdomen: soft, non-tender; bowel sounds normal Pelvic: 8/80/-3 Extremities: Homans sign is negative, no sign of  DVT Presentation: cephalic Fetal monitoringBaseline: 150 bpm, Variability: Good {> 6 bpm), and Accelerations: Reactive Uterine activity every ~3 minutes on toco Dilation: 8 Effacement (%): 80 Station: -3 Exam by:: Dr Haywood Lasso   Prenatal labs: ABO, Rh: --/--/PENDING (11/02 2342) Antibody: PENDING (11/02 2342) Rubella: 1.57 (07/16 1500) RPR: Non Reactive (07/31 0847)  HBsAg: Negative (04/25 1038)  HIV: Non Reactive (07/31 0847)  GBS: Negative/-- (10/02 0259)  2 hr Glucola passed Genetic screening  LR Female  Anatomy US Normal anatomy, EFW 96%  Prenatal Transfer Tool  Maternal Diabetes: No Genetic Screening: Normal Maternal Ultrasounds/Referrals: Normal Fetal Ultrasounds or other Referrals:  None Maternal Substance Abuse:  No Significant Maternal Medications:  None Significant Maternal Lab Results:  Group B Strep negative and Other: thrombocytopenic plt 145 Number of Prenatal Visits:greater than 3 verified prenatal visits Other Comments:  None  Results for orders placed or performed during the hospital encounter of 03/26/23 (from the past 24 hour(s))  Type and screen   Collection Time: 03/26/23 11:42 PM  Result Value Ref Range   ABO/RH(D) PENDING    Antibody Screen PENDING    Sample Expiration      03/29/2023,2359 Performed at [facility], [address], [place], [place] [address]     Patient Active Problem List   Diagnosis Date Noted   Post term pregnancy over 40 weeks 03/26/2023   Gestational thrombocytopenia (HCC) 12/23/2022   Request for sterilization 12/22/2022   Alpha thalassemia silent carrier 09/30/2022   Encounter for supervision of normal pregnancy, antepartum 09/16/2022   Obesity (BMI 35.0-39.9 without comorbidity) 09/16/2022   Anemia 07/02/2020    Assessment/Plan:  Sharon Lutz is a 37 y.o. [id number] at [redacted]w[redacted]d here for SROM.   #Labor:Presented for SROM, in active labor. Anticipate vaginal delivery #Pain: Desires epidural  #FWB: Cat 1   #ID:  GBS- #MOF: Pumping  #MOC:BTL consented 12/21/22 #Circ:  Yes  #Obesity: 38.51%, more hips than abdominal habitus  #Post Dates: @[redacted]w[redacted]d , 3244 88%, proven to 4026g   #Gestational Thrombocytopenia: admission Plts 127 (130 two weeks prior)  #Anemia: admission Hgb 12.1  #Alpha thal silent carrier: unknown FOB status  #Desires Sterilization: per chart review states signed papers for BTL on 7/31, but no copy of consent found in chart.   Bryna Colander, MD  03/27/2023, 12:08 AM  Evaluation and management procedures were performed by the [facility] Medicine Resident under my supervision. I was immediately available for direct supervision, assistance and direction throughout this encounter.  I also confirm that I have verified the information documented in the  resident's note, and that I have also personally reperformed the pertinent components of the physical exam and all of the medical decision making activities.  I have also made any necessary editorial changes.   Mittie Bodo, MD Family Medicine - Obstetrics Fellow

## 2023-03-26 NOTE — MAU Note (Signed)
.  Sharon Lutz is a 37 y.o. at [redacted]w[redacted]d here in MAU reporting: water broke at 2230 - clear fluid and continuing to leak. Reports ctx started after ROM and have been every 5 minutes. Denies VB. +FM   Onset of complaint: 2230 Pain score: 10 Vitals:   03/26/23 2326  BP: 128/81  Pulse: 92  Resp: 20  Temp: 98.4 F (36.9 C)  SpO2: 100%     FHT:145 Lab orders placed from triage:  labor eval

## 2023-03-26 NOTE — MAU Note (Signed)
Pt obix had been moved to room 124- 10 min FHR tracing lost

## 2023-03-27 ENCOUNTER — Inpatient Hospital Stay (HOSPITAL_COMMUNITY): Payer: Medicaid Other | Admitting: Anesthesiology

## 2023-03-27 ENCOUNTER — Other Ambulatory Visit: Payer: Self-pay

## 2023-03-27 ENCOUNTER — Encounter (HOSPITAL_COMMUNITY): Payer: Self-pay | Admitting: Obstetrics and Gynecology

## 2023-03-27 DIAGNOSIS — Z3A40 40 weeks gestation of pregnancy: Secondary | ICD-10-CM

## 2023-03-27 DIAGNOSIS — O480 Post-term pregnancy: Secondary | ICD-10-CM

## 2023-03-27 DIAGNOSIS — O4202 Full-term premature rupture of membranes, onset of labor within 24 hours of rupture: Secondary | ICD-10-CM

## 2023-03-27 DIAGNOSIS — O9912 Other diseases of the blood and blood-forming organs and certain disorders involving the immune mechanism complicating childbirth: Secondary | ICD-10-CM

## 2023-03-27 LAB — CBC
HCT: 32.8 % — ABNORMAL LOW (ref 36.0–46.0)
HCT: 36.4 % (ref 36.0–46.0)
Hemoglobin: 10.6 g/dL — ABNORMAL LOW (ref 12.0–15.0)
Hemoglobin: 12.1 g/dL (ref 12.0–15.0)
MCH: 28.6 pg (ref 26.0–34.0)
MCH: 29.5 pg (ref 26.0–34.0)
MCHC: 32.3 g/dL (ref 30.0–36.0)
MCHC: 33.2 g/dL (ref 30.0–36.0)
MCV: 88.4 fL (ref 80.0–100.0)
MCV: 88.8 fL (ref 80.0–100.0)
Platelets: 127 10*3/uL — ABNORMAL LOW (ref 150–400)
Platelets: 133 10*3/uL — ABNORMAL LOW (ref 150–400)
RBC: 3.71 MIL/uL — ABNORMAL LOW (ref 3.87–5.11)
RBC: 4.1 MIL/uL (ref 3.87–5.11)
RDW: 14 % (ref 11.5–15.5)
RDW: 14.1 % (ref 11.5–15.5)
WBC: 12.8 10*3/uL — ABNORMAL HIGH (ref 4.0–10.5)
WBC: 5.8 10*3/uL (ref 4.0–10.5)
nRBC: 0 % (ref 0.0–0.2)
nRBC: 0 % (ref 0.0–0.2)

## 2023-03-27 LAB — TYPE AND SCREEN
ABO/RH(D): O POS
Antibody Screen: NEGATIVE

## 2023-03-27 LAB — RPR: RPR Ser Ql: NONREACTIVE

## 2023-03-27 LAB — POCT FERN TEST

## 2023-03-27 MED ORDER — SIMETHICONE 80 MG PO CHEW: 80.0000 mg | CHEWABLE_TABLET | ORAL | Status: DC | PRN

## 2023-03-27 MED ORDER — ONDANSETRON HCL 4 MG/2ML IJ SOLN: 4.0000 mg | INTRAMUSCULAR | Status: DC | PRN

## 2023-03-27 MED ORDER — TRANEXAMIC ACID-NACL 1000-0.7 MG/100ML-% IV SOLN: 1000.0000 mg | INTRAVENOUS | Status: AC

## 2023-03-27 MED ORDER — LIDOCAINE HCL (PF) 1 % IJ SOLN
INTRAMUSCULAR | Status: DC | PRN
  Administered 2023-03-27 (×2): 44 mL via EPIDURAL

## 2023-03-27 MED ORDER — ZOLPIDEM TARTRATE 5 MG PO TABS: 5.0000 mg | ORAL_TABLET | Freq: Every evening | ORAL | Status: DC | PRN

## 2023-03-27 MED ORDER — TRANEXAMIC ACID-NACL 1000-0.7 MG/100ML-% IV SOLN: 1000.0000 mg | INTRAVENOUS | Status: DC

## 2023-03-27 MED ORDER — WITCH HAZEL-GLYCERIN EX PADS: 1.0000 | MEDICATED_PAD | CUTANEOUS | Status: DC | PRN

## 2023-03-27 MED ORDER — SENNOSIDES-DOCUSATE SODIUM 8.6-50 MG PO TABS
2.0000 | ORAL_TABLET | ORAL | Status: DC
  Administered 2023-03-27 – 2023-03-28 (×2): 2 via ORAL
  Filled 2023-03-27 (×2): qty 2

## 2023-03-27 MED ORDER — BENZOCAINE-MENTHOL 20-0.5 % EX AERO
1.0000 | INHALATION_SPRAY | CUTANEOUS | Status: DC | PRN
  Administered 2023-03-27: 1 via TOPICAL
  Filled 2023-03-27: qty 56

## 2023-03-27 MED ORDER — PRENATAL MULTIVITAMIN CH
1.0000 | ORAL_TABLET | Freq: Every day | ORAL | Status: DC
  Administered 2023-03-27: 1 via ORAL
  Filled 2023-03-27: qty 1

## 2023-03-27 MED ORDER — SODIUM CHLORIDE 0.9% FLUSH
3.0000 mL | INTRAVENOUS | Status: DC | PRN
Start: 2023-03-27 — End: 2023-03-29

## 2023-03-27 MED ORDER — ACETAMINOPHEN 325 MG PO TABS
650.0000 mg | ORAL_TABLET | ORAL | Status: DC | PRN
  Administered 2023-03-27: 650 mg via ORAL
  Filled 2023-03-27: qty 2

## 2023-03-27 MED ORDER — TRANEXAMIC ACID-NACL 1000-0.7 MG/100ML-% IV SOLN
INTRAVENOUS | Status: AC
  Administered 2023-03-27: 1000 mg
  Filled 2023-03-27: qty 100

## 2023-03-27 MED ORDER — IBUPROFEN 600 MG PO TABS
600.0000 mg | ORAL_TABLET | Freq: Four times a day (QID) | ORAL | Status: DC
  Administered 2023-03-27 – 2023-03-28 (×5): 600 mg via ORAL
  Filled 2023-03-27 (×5): qty 1

## 2023-03-27 MED ORDER — LACTATED RINGERS IV SOLN: INTRAVENOUS | Status: DC

## 2023-03-27 MED ORDER — METOCLOPRAMIDE HCL 10 MG PO TABS
10.0000 mg | ORAL_TABLET | Freq: Once | ORAL | Status: AC
  Administered 2023-03-28: 10 mg via ORAL
  Filled 2023-03-27: qty 1

## 2023-03-27 MED ORDER — SODIUM CHLORIDE 0.9% FLUSH
10.0000 mL | Freq: Two times a day (BID) | INTRAVENOUS | Status: DC
  Administered 2023-03-27: 10 mL via INTRAVENOUS

## 2023-03-27 MED ORDER — FAMOTIDINE 20 MG PO TABS
40.0000 mg | ORAL_TABLET | Freq: Once | ORAL | Status: AC
  Administered 2023-03-28: 40 mg via ORAL
  Filled 2023-03-27: qty 2

## 2023-03-27 MED ORDER — ONDANSETRON HCL 4 MG PO TABS: 4.0000 mg | ORAL_TABLET | ORAL | Status: DC | PRN

## 2023-03-27 MED ORDER — DIBUCAINE (PERIANAL) 1 % EX OINT: 1.0000 | TOPICAL_OINTMENT | CUTANEOUS | Status: DC | PRN

## 2023-03-27 MED ORDER — DIPHENHYDRAMINE HCL 25 MG PO CAPS: 25.0000 mg | ORAL_CAPSULE | Freq: Four times a day (QID) | ORAL | Status: DC | PRN

## 2023-03-27 MED ORDER — COCONUT OIL OIL: 1.0000 | TOPICAL_OIL | Status: DC | PRN

## 2023-03-27 MED ORDER — SODIUM CHLORIDE 0.9% FLUSH
3.0000 mL | Freq: Two times a day (BID) | INTRAVENOUS | Status: DC
  Administered 2023-03-27: 3 mL via INTRAVENOUS

## 2023-03-27 NOTE — Anesthesia Postprocedure Evaluation (Signed)
Anesthesia Post Note  Patient: Sharon Lutz  Procedure(s) Performed: AN AD HOC LABOR EPIDURAL     Patient location during evaluation: Mother Baby Anesthesia Type: Epidural Level of consciousness: awake Pain management: satisfactory to patient Vital Signs Assessment: post-procedure vital signs reviewed and stable Respiratory status: spontaneous breathing Cardiovascular status: stable Anesthetic complications: no  No notable events documented.  Last Vitals:  Vitals:   03/27/23 0800 03/27/23 1200  BP: 108/76 105/78  Pulse: 72 83  Resp: 16 16  Temp: 36.8 C 36.7 C  SpO2: 99% 100%    Last Pain:  Vitals:   03/27/23 1200  TempSrc: Oral  PainSc: 3    Pain Goal:                   [employer]

## 2023-03-27 NOTE — Anesthesia Procedure Notes (Signed)
Epidural Patient location during procedure: OB Start time: 03/27/2023 12:20 AM End time: 03/27/2023 12:27 AM  Staffing Anesthesiologist: Linton Rump, MD Performed: anesthesiologist   Preanesthetic Checklist Completed: patient identified, IV checked, site marked, risks and benefits discussed, surgical consent, monitors and equipment checked, pre-op evaluation and timeout performed  Epidural Patient position: sitting Prep: DuraPrep and site prepped and draped Patient monitoring: continuous pulse ox and blood pressure Approach: midline Location: L3-L4 Injection technique: LOR saline  Needle:  Needle type: Tuohy  Needle gauge: 17 G Needle length: 9 cm and 9 Needle insertion depth: 8 cm Catheter type: closed end flexible Catheter size: 19 Gauge Catheter at skin depth: 12 cm Test dose: negative  Assessment Events: blood not aspirated, no cerebrospinal fluid, injection not painful, no injection resistance, no paresthesia and negative IV test  Additional Notes The patient has requested an epidural for labor pain management. Risks and benefits including, but not limited to, infection, bleeding, local anesthetic toxicity, headache, hypotension, back pain, block failure, etc. were discussed with the patient. The patient expressed understanding and consented to the procedure. I confirmed that the patient has no bleeding disorders and is not taking blood thinners. I confirmed the patient's last platelet count with the nurse. A time-out was performed immediately prior to the procedure. Please see nursing documentation for vital signs. Sterile technique was used throughout the whole procedure. Once LOR achieved, the epidural catheter threaded easily without resistance. Aspiration of the catheter was negative for blood and CSF. The epidural was dosed slowly and an infusion was started.  1 attempt(s)Reason for block:procedure for pain

## 2023-03-27 NOTE — Anesthesia Preprocedure Evaluation (Signed)
Anesthesia Evaluation  Patient identified by MRN, date of birth, ID band Patient awake    Reviewed: Allergy & Precautions, NPO status , Patient's Chart, lab work & pertinent test results  History of Anesthesia Complications Negative for: history of anesthetic complications  Airway Mallampati: III  TM Distance: >3 FB Neck ROM: Full    Dental  (+) Dental Advisory Given   Pulmonary neg pulmonary ROS   breath sounds clear to auscultation       Cardiovascular negative cardio ROS  Rhythm:Regular     Neuro/Psych negative neurological ROS     GI/Hepatic negative GI ROS, Neg liver ROS,,,  Endo/Other  negative endocrine ROS    Renal/GU negative Renal ROS     Musculoskeletal   Abdominal  (+) + obese  Peds  Hematology  (+) Blood dyscrasia (alpha thalassemia silent carrier, gestational thrombocytopenia), anemia Lab Results      Component                Value               Date                      WBC                      12.8 (H)            03/27/2023                HGB                      10.6 (L)            03/27/2023                HCT                      32.8 (L)            03/27/2023                MCV                      88.4                03/27/2023                PLT                      133 (L)             03/27/2023              Anesthesia Other Findings Takes baby aspirin.  Reproductive/Obstetrics                              Anesthesia Physical Anesthesia Plan  ASA: 2  Anesthesia Plan: Epidural   Post-op Pain Management:    Induction:   PONV Risk Score and Plan: 2 and Ondansetron and Dexamethasone  Airway Management Planned: Natural Airway, Simple Face Mask and Nasal Cannula  Additional Equipment: None  Intra-op Plan:   Post-operative Plan:   Informed Consent: I have reviewed the patients History and Physical, chart, labs and discussed the procedure including the  risks, benefits and alternatives for the proposed anesthesia with the patient or authorized representative who has indicated his/her understanding and acceptance.  Plan Discussed with: Anesthesiologist and CRNA  Anesthesia Plan Comments:         Anesthesia Quick Evaluation

## 2023-03-27 NOTE — MAU Note (Signed)
Pt transferred to LD via stretcher with Raelyn Mora in attendance for advanced dilitation. Providers in room on arrival. Care relinquished

## 2023-03-27 NOTE — Lactation Note (Signed)
This note was copied from a baby's chart. Lactation Consultation Note  Patient Name: Sharon Lutz [id number] Date: 03/27/2023 Age:36 hours Reason for consult: Initial assessment;Exclusive pumping and bottle feeding;Term  P3- MOB plans to exclusively pump for infant, while also offering formula. MOB initially planned to formula feed only while in the hospital and pump once at home, but [facility] encouraged MOB to start pumping in the hospital to help with stimulation. MOB agreed to [facility] setting up a pump. LC set up the DEBP with size 21 mm flanges. LC reviewed how to use the pump, how to clean the parts and how often she should pump. MOB verbalized understanding.  LC reviewed feeding infant on cue 8-12x in 24 hrs, not allowing infant to go over 3 hrs without eating, CDC milk storage guidelines and LC services handout. LC encouraged MOB to call [facility] for further assistance as needed.  Maternal Data Does the patient have breastfeeding experience prior to this delivery?: Yes How long did the patient breastfeed?: 3 months  Feeding Mother's Current Feeding Choice: Breast Milk and Formula Nipple Type: Slow - flow  Lactation Tools Discussed/Used Tools: Pump;Flanges Flange Size: 21 Breast pump type: Double-Electric Breast Pump;Manual Pump Education: Setup, frequency, and cleaning;Milk Storage Reason for Pumping: exclusive pumping Pumping frequency: 15-20 min every 2-3 hrs  Interventions Interventions: Breast feeding basics reviewed;Hand pump;DEBP;Education;LC Services brochure  Discharge Discharge Education: Warning signs for feeding baby Pump: DEBP;Personal  Consult Status Consult Status: Follow-up Date: 03/28/23 Follow-up type: In-patient    Dema Severin BS, IBCLC 03/27/2023, 6:01 PM

## 2023-03-27 NOTE — Discharge Summary (Signed)
Postpartum Discharge Summary  Date of Service updated***     Patient Name: Sharon Lutz DOB: Sep 24, 1985 MRN: [id number]  Date of admission: 03/26/2023 Delivery date:03/27/2023 Delivering provider: CHUBB, CASEY C Date of discharge: 03/27/2023  Admitting diagnosis: Post term pregnancy over 40 weeks [O48.0] Intrauterine pregnancy: [redacted]w[redacted]d     Secondary diagnosis:  Principal Problem:   Post term pregnancy over 40 weeks  Additional problems: Shoulder Dystocia <104min***    Discharge diagnosis: Term Pregnancy Delivered and Shoulder Dystocia                                               Post partum procedures:{Postpartum procedures:23558} Augmentation: N/A Complications: None  Hospital course: Onset of Labor With Vaginal Delivery      37 y.o. yo [id number] at [redacted]w[redacted]d was admitted in Active Labor on 03/26/2023. Labor course was complicated by<86min shoulder dystocia requiring McRoberts, Suprapubic pressure, and hooking R axilla and delivery of anterior arm (RUE).    Membrane Rupture Time/Date: 10:30 PM,03/26/2023  Delivery Method:Vaginal, Spontaneous Operative Delivery:N/A Episiotomy: None Lacerations:  Periurethral Patient had a postpartum course complicated by ***.  She is ambulating, tolerating a regular diet, passing flatus, and urinating well. Patient is discharged home in stable condition on 03/27/23.  Newborn Data: Birth date:03/27/2023 Birth time:1:43 AM Gender:Female Living status:Living Apgars:8 ,9  Weight:4470 g  Magnesium Sulfate received: {Mag received:30440022} BMZ received: No Rhophylac:N/A MMR:N/A T-DaP: Declined Flu: {ONG:29528} RSV Vaccine received: {RSV:31013} Transfusion:{Transfusion received:30440034}  Immunizations received: There is no immunization history for the selected administration types on file for this patient.  Physical exam  Vitals:   03/27/23 0050 03/27/23 0055 03/27/23 0115 03/27/23 0131  BP: 123/77 126/79 118/80 116/75  Pulse: 80 92 93  80  Resp:      Temp:      TempSrc:      SpO2: 97% 99% 99%   Weight:      Height:       General: {Exam; general:21111117} Lochia: {Desc; appropriate/inappropriate:30686::"appropriate"} Uterine Fundus: {Desc; firm/soft:30687} Incision: {Exam; incision:21111123} DVT Evaluation: {Exam; dvt:2111122} Labs: Lab Results  Component Value Date   WBC 5.8 03/26/2023   HGB 12.1 03/26/2023   HCT 36.4 03/26/2023   MCV 88.8 03/26/2023   PLT 127 (L) 03/26/2023      10/29/2006   10:06 PM  CMP  Glucose 94   BUN 13   Creatinine 1.0   Sodium 137   Potassium 3.7   Chloride 106    Edinburgh Score:     No data to display         No data recorded  After visit meds:  Allergies as of 03/27/2023   No Known Allergies   Med Rec must be completed prior to using this [facility]***        Discharge home in stable condition Infant Feeding: {Baby feeding:23562} Infant Disposition:{CHL IP OB HOME WITH UXLKGM:01027} Discharge instruction: per After Visit Summary and Postpartum booklet. Activity: Advance as tolerated. Pelvic rest for 6 weeks.  Diet: {OB OZDG:64403474} Future Appointments: Future Appointments  Date Time Provider Department Center  03/30/2023 12:00 AM [facility] SCHED ROOM [facility] None   Follow up Visit: Sent message to FT 11/3  Please schedule this patient for a In person postpartum visit in 4 weeks with the following provider: Any provider. Additional Postpartum F/U: CBC to check plts given gestational thrombocytopenia  Low risk pregnancy complicated by:  Shoulder dystocia Delivery mode:  Vaginal, Spontaneous Anticipated Birth Control:   BTL but unable to find signed consent   03/27/2023 Hessie Dibble, MD

## 2023-03-28 ENCOUNTER — Encounter (HOSPITAL_COMMUNITY): Payer: Self-pay | Admitting: Family Medicine

## 2023-03-28 ENCOUNTER — Other Ambulatory Visit: Payer: Self-pay

## 2023-03-28 ENCOUNTER — Encounter (HOSPITAL_COMMUNITY)
Admission: AD | Disposition: A | Discharge status: Home / Self Care | Payer: Self-pay | Source: Home / Self Care | Attending: Obstetrics and Gynecology

## 2023-03-28 DIAGNOSIS — Z302 Encounter for sterilization: Secondary | ICD-10-CM | POA: Diagnosis not present

## 2023-03-28 HISTORY — PX: TUBAL LIGATION: SHX77

## 2023-03-28 SURGERY — LIGATION, FALLOPIAN TUBE, POSTPARTUM
Anesthesia: Epidural | Site: Abdomen | Laterality: Bilateral

## 2023-03-28 MED ORDER — KETOROLAC TROMETHAMINE 30 MG/ML IJ SOLN: 30.0000 mg | Freq: Once | INTRAMUSCULAR | Status: DC | PRN

## 2023-03-28 MED ORDER — FENTANYL CITRATE (PF) 100 MCG/2ML IJ SOLN: 25.0000 ug | INTRAMUSCULAR | Status: DC | PRN

## 2023-03-28 MED ORDER — SENNOSIDES-DOCUSATE SODIUM 8.6-50 MG PO TABS: 2.0000 | ORAL_TABLET | ORAL | 0 refills | Status: AC

## 2023-03-28 MED ORDER — DEXAMETHASONE SODIUM PHOSPHATE 10 MG/ML IJ SOLN
INTRAMUSCULAR | Status: DC | PRN
  Administered 2023-03-28: 10 mg via INTRAVENOUS

## 2023-03-28 MED ORDER — IBUPROFEN 600 MG PO TABS: 600.0000 mg | ORAL_TABLET | Freq: Four times a day (QID) | ORAL | 0 refills | Status: AC

## 2023-03-28 MED ORDER — FENTANYL CITRATE (PF) 100 MCG/2ML IJ SOLN
INTRAMUSCULAR | Status: DC | PRN
  Administered 2023-03-28: 50 ug via INTRAVENOUS

## 2023-03-28 MED ORDER — PHENYLEPHRINE HCL (PRESSORS) 10 MG/ML IV SOLN
INTRAVENOUS | Status: DC | PRN
  Administered 2023-03-28: 160 ug via INTRAVENOUS
  Administered 2023-03-28: 80 ug via INTRAVENOUS
  Administered 2023-03-28: 160 ug via INTRAVENOUS

## 2023-03-28 MED ORDER — OXYCODONE HCL 5 MG PO TABS: 5.0000 mg | ORAL_TABLET | Freq: Once | ORAL | Status: DC | PRN

## 2023-03-28 MED ORDER — ACETAMINOPHEN 500 MG PO TABS: 1000.0000 mg | ORAL_TABLET | Freq: Once | ORAL | Status: DC | PRN

## 2023-03-28 MED ORDER — MIDAZOLAM HCL 2 MG/2ML IJ SOLN
INTRAMUSCULAR | Status: DC | PRN
  Administered 2023-03-28: 1 mg via INTRAVENOUS

## 2023-03-28 MED ORDER — BUPIVACAINE HCL (PF) 0.25 % IJ SOLN
INTRAMUSCULAR | Status: AC
  Filled 2023-03-28: qty 20

## 2023-03-28 MED ORDER — FENTANYL CITRATE (PF) 100 MCG/2ML IJ SOLN
INTRAMUSCULAR | Status: DC | PRN
  Administered 2023-03-28: 100 ug via EPIDURAL

## 2023-03-28 MED ORDER — ONDANSETRON HCL 4 MG/2ML IJ SOLN
INTRAMUSCULAR | Status: DC | PRN
  Administered 2023-03-28: 4 mg via INTRAVENOUS

## 2023-03-28 MED ORDER — MORPHINE SULFATE (PF) 0.5 MG/ML IJ SOLN
INTRAMUSCULAR | Status: AC
  Filled 2023-03-28: qty 10

## 2023-03-28 MED ORDER — FENTANYL CITRATE (PF) 100 MCG/2ML IJ SOLN
INTRAMUSCULAR | Status: AC
  Filled 2023-03-28: qty 2

## 2023-03-28 MED ORDER — OXYCODONE HCL 5 MG/5ML PO SOLN: 5.0000 mg | Freq: Once | ORAL | Status: DC | PRN

## 2023-03-28 MED ORDER — LIDOCAINE-EPINEPHRINE (PF) 2 %-1:200000 IJ SOLN
INTRAMUSCULAR | Status: DC | PRN
  Administered 2023-03-28 (×4): 5 mL via EPIDURAL

## 2023-03-28 MED ORDER — SODIUM CHLORIDE 0.9 % IR SOLN
Status: DC | PRN
  Administered 2023-03-28: 1000 mL

## 2023-03-28 MED ORDER — BUPIVACAINE HCL (PF) 0.25 % IJ SOLN
INTRAMUSCULAR | Status: DC | PRN
  Administered 2023-03-28: 30 mL

## 2023-03-28 MED ORDER — MIDAZOLAM HCL 2 MG/2ML IJ SOLN
INTRAMUSCULAR | Status: AC
  Filled 2023-03-28: qty 2

## 2023-03-28 MED ORDER — BUPIVACAINE HCL (PF) 0.25 % IJ SOLN
INTRAMUSCULAR | Status: AC
  Filled 2023-03-28: qty 10

## 2023-03-28 MED ORDER — OXYCODONE-ACETAMINOPHEN 5-325 MG PO TABS: 1.0000 | ORAL_TABLET | Freq: Three times a day (TID) | ORAL | 0 refills | Status: AC | PRN

## 2023-03-28 MED ORDER — STERILE WATER FOR IRRIGATION IR SOLN
Status: DC | PRN
  Administered 2023-03-28: 1000 mL

## 2023-03-28 MED ORDER — ACETAMINOPHEN 160 MG/5ML PO SOLN: 1000.0000 mg | Freq: Once | ORAL | Status: DC | PRN

## 2023-03-28 SURGICAL SUPPLY — 23 items
ADH SKN CLS APL DERMABOND .7 (GAUZE/BANDAGES/DRESSINGS) ×1
BLADE SURG 11 STRL SS (BLADE) ×1 IMPLANT
CLIP FILSHIE TUBAL LIGA STRL (Clip) ×1 IMPLANT
DERMABOND ADVANCED .7 DNX12 (GAUZE/BANDAGES/DRESSINGS) IMPLANT
DRSG OPSITE POSTOP 3X4 (GAUZE/BANDAGES/DRESSINGS) ×1 IMPLANT
DRSG OPSITE POSTOP 4X10 (GAUZE/BANDAGES/DRESSINGS) IMPLANT
DURAPREP 26ML APPLICATOR (WOUND CARE) ×1 IMPLANT
GLOVE BIOGEL PI IND STRL 7.0 (GLOVE) ×1 IMPLANT
GLOVE BIOGEL PI IND STRL 7.5 (GLOVE) ×1 IMPLANT
GLOVE ECLIPSE 7.5 STRL STRAW (GLOVE) ×1 IMPLANT
GOWN STRL REUS W/TWL LRG LVL3 (GOWN DISPOSABLE) ×2 IMPLANT
HIBICLENS CHG 4% 4OZ BTL (MISCELLANEOUS) ×1 IMPLANT
LIGASURE IMPACT 36 18CM CVD LR (INSTRUMENTS) IMPLANT
NEEDLE HYPO 22GX1.5 SAFETY (NEEDLE) ×1 IMPLANT
NS IRRIG 1000ML POUR BTL (IV SOLUTION) ×1 IMPLANT
PACK ABDOMINAL MINOR (CUSTOM PROCEDURE TRAY) ×1 IMPLANT
PROTECTOR NERVE ULNAR (MISCELLANEOUS) ×1 IMPLANT
SPONGE LAP 4X18 RFD (DISPOSABLE) IMPLANT
SUT VICRYL 0 UR6 27IN ABS (SUTURE) ×1 IMPLANT
SUT VICRYL 4-0 PS2 18IN ABS (SUTURE) ×1 IMPLANT
SYR CONTROL 10ML LL (SYRINGE) ×1 IMPLANT
TOWEL OR 17X24 6PK STRL BLUE (TOWEL DISPOSABLE) ×2 IMPLANT
TRAY FOLEY W/BAG SLVR 14FR (SET/KITS/TRAYS/PACK) ×1 IMPLANT

## 2023-03-28 NOTE — Lactation Note (Signed)
This note was copied from a baby's chart. Lactation Consultation Note  Patient Name: Sharon Lutz [id number] Date: 03/28/2023 Age:36 hours, P3  Reason for consult: Follow-up assessment;Exclusive pumping and bottle feeding;Term LC's reviewed supply and demand, importance of pumping at least 8 times a day both breast at a pumping.  LC discussed power pump over 60 mins - 20 mins on 10 off over 60 mins.  LC reviewed engorgement prevention and tx , see below under interventions.  LC assisted to check the diaper , dry and handed baby to mom for her to bottle.    Maternal Data Does the patient have breastfeeding experience prior to this delivery?: Yes How long did the patient breastfeed?: per mom didn't Breastfeed her 1st and pumped x 4 months with 2nd baby  Feeding Mother's Current Feeding Choice: Breast Milk and Formula Nipple Type: Slow - flow    Lactation Tools Discussed/Used Tools: Pump;Flanges Flange Size: 21 Breast pump type: Double-Electric Breast Pump Pump Education: Milk Storage (has already has been pumping) Reason for Pumping: Moms desire to pump and bottle feed  only Pumped volume: 0 mL  Interventions Interventions: DEBP;Education;LC Services brochure;CDC Guidelines for Breast Pump Cleaning  Discharge Discharge Education: Engorgement and breast care;Warning signs for feeding baby Pump: DEBP;Personal (per mom Motif DEBP)  Consult Status Consult Status: Complete Date: 03/28/23    Sharon Lutz 03/28/2023, 2:30 PM

## 2023-03-28 NOTE — Op Note (Signed)
Sharon Lutz 03/28/2023  PREOPERATIVE DIAGNOSIS:  Undesired fertility  POSTOPERATIVE DIAGNOSIS:  Undesired fertility  PROCEDURE:  Postpartum Bilateral Tubal Sterilization using Ligasure   SURGEON:  Dr Candelaria Celeste  ASSISTANT: Sundra Aland, MD  ANESTHESIA:  Epidural  COMPLICATIONS:  None immediate.  ESTIMATED BLOOD LOSS:  Less than 20cc.  FLUIDS: 1000 mL LR.  URINE OUTPUT:  200 mL of clear urine.  INDICATIONS: 37 y.o. yo [id number]  with undesired fertility,status post vaginal delivery, desires permanent sterilization. Risks and benefits of procedure discussed with patient including permanence of method, bleeding, infection, injury to surrounding organs and need for additional procedures. Risk failure of 0.5-1% with increased risk of ectopic gestation if pregnancy occurs was also discussed with patient.   FINDINGS:  Normal uterus, tubes, and ovaries.  TECHNIQUE:  The patient was taken to the operating room where her epidural anesthesia was dosed up to surgical level and found to be adequate.  She was then placed in the dorsal supine position and prepped and draped in sterile fashion.  After an adequate timeout was performed, attention was turned to the patient's abdomen where a small transverse skin incision was made under the umbilical fold. The incision was taken down to the layer of fascia using the scalpel, and fascia was incised, and extended bilaterally using Mayo scissors. The peritoneum was entered in a sharp fashion.   Attention was then turned to the patient's adnexa. The left fallopian tube was identified and followed out to the fimbriated end.  Ligasure device was used to cauterize and cut the mesosalpinx to proximal end of the fallopian tube, removing 6cm of tube. A similar process was carried out on the right side allowing for bilateral tubal sterilization.    Good hemostasis was noted overall. The instruments were then removed from the patient's abdomen. 30cc of  0.25% Marcaine was infiltrated subcutaneously and the fascial incision was repaired with 0 Vicryl, and the skin was closed with a 4-0 Monocryl subcuticular stitch. The patient tolerated the procedure well.  Sponge, lap, and needle counts were correct times two.  The patient was then taken to the recovery room awake, extubated and in stable condition.   Sundra Aland, MD 03/28/2023 12:26 PM

## 2023-03-28 NOTE — Progress Notes (Signed)
Post Partum Day 1 Subjective: no complaints, up ad lib, and tolerating PO  Objective: Blood pressure 104/68, pulse 67, temperature 98.5 F (36.9 C), temperature source Oral, resp. rate 17, height 5\' 9"  (1.753 m), weight 118.3 kg, last menstrual period 06/16/2022, SpO2 100%, unknown if currently breastfeeding.  Physical Exam:  General: alert, cooperative, and no distress Lochia: appropriate Uterine Fundus: firm Incision: n/a DVT Evaluation: No evidence of DVT seen on physical exam. Negative Homan's sign. No cords or calf tenderness.  Recent Labs    03/26/23 2344 03/27/23 0452  HGB 12.1 10.6*  HCT 36.4 32.8*    Assessment/Plan: Plan for discharge tomorrow  Risks of procedure discussed with patient including but not limited to: risk of regret, permanence of method, bleeding, infection, injury to surrounding organs and need for additional procedures.  Failure risk of 1 -2 % with increased risk of ectopic gestation if pregnancy occurs was also discussed with patient.      LOS: 2 days   Sharon Heritage, DO 03/28/2023, 8:50 AM

## 2023-03-28 NOTE — Transfer of Care (Signed)
Immediate Anesthesia Transfer of Care Note  Patient: Sharon Lutz  Procedure(s) Performed: POST PARTUM TUBAL LIGATION (Bilateral: Abdomen)  Patient Location: PACU  Anesthesia Type:Epidural  Level of Consciousness: awake  Airway & Oxygen Therapy: Patient Spontanous Breathing  Post-op Assessment: Report given to RN  Post vital signs: Reviewed and stable  Last Vitals:  Vitals Value Taken Time  BP    Temp    Pulse 79 03/28/23 1210  Resp    SpO2 96 % 03/28/23 1210  Vitals shown include unfiled device data.  Last Pain:  Vitals:   03/28/23 0820  TempSrc:   PainSc: 0-No pain         Complications: No notable events documented.

## 2023-03-30 ENCOUNTER — Inpatient Hospital Stay (HOSPITAL_COMMUNITY): Payer: Medicaid Other

## 2023-03-30 ENCOUNTER — Inpatient Hospital Stay (HOSPITAL_COMMUNITY): Admission: RE | Admit: 2023-03-30 | Payer: Medicaid Other | Source: Home / Self Care | Admitting: Family Medicine

## 2023-03-30 LAB — SURGICAL PATHOLOGY

## 2023-04-11 ENCOUNTER — Telehealth (HOSPITAL_COMMUNITY): Payer: Self-pay | Admitting: *Deleted

## 2023-04-11 NOTE — Telephone Encounter (Signed)
Attempted hospital discharge follow-up call. Left message for patient to return RN call with any questions or concerns. Deforest Hoyles, RN, 04/11/23, [phone]

## 2023-04-25 ENCOUNTER — Ambulatory Visit: Payer: Medicaid Other | Admitting: Obstetrics & Gynecology

## 2023-05-09 ENCOUNTER — Other Ambulatory Visit: Payer: Self-pay | Admitting: Women's Health

## 2023-05-09 DIAGNOSIS — O26893 Other specified pregnancy related conditions, third trimester: Secondary | ICD-10-CM
